# Patient Record
Sex: Male | Born: 1970
Health system: Southern US, Community
[De-identification: ages and names within clinical notes are randomized; demographics above are authoritative.]

## PROBLEM LIST (undated history)

## (undated) DIAGNOSIS — J45909 Unspecified asthma, uncomplicated: Secondary | ICD-10-CM

## (undated) DIAGNOSIS — I456 Pre-excitation syndrome: Secondary | ICD-10-CM

## (undated) DIAGNOSIS — F1021 Alcohol dependence, in remission: Secondary | ICD-10-CM

## (undated) DIAGNOSIS — I10 Essential (primary) hypertension: Secondary | ICD-10-CM

## (undated) DIAGNOSIS — T7840XA Allergy, unspecified, initial encounter: Secondary | ICD-10-CM

## (undated) HISTORY — PX: CORONARY ARTERY BYPASS GRAFT: SHX141

## (undated) HISTORY — DX: Pre-excitation syndrome: I45.6

## (undated) HISTORY — DX: Allergy, unspecified, initial encounter: T78.40XA

## (undated) HISTORY — PX: CARDIAC SURGERY: SHX584

## (undated) HISTORY — DX: Essential (primary) hypertension: I10

## (undated) HISTORY — DX: Unspecified asthma, uncomplicated: J45.909

## (undated) HISTORY — DX: Alcohol dependence, in remission: F10.21

---

## 2008-01-13 ENCOUNTER — Emergency Department (HOSPITAL_COMMUNITY): Admission: EM | Admit: 2008-01-13 | Discharge: 2008-01-13 | Payer: Self-pay | Admitting: Emergency Medicine

## 2008-07-16 ENCOUNTER — Encounter: Admission: RE | Admit: 2008-07-16 | Discharge: 2008-07-16 | Payer: Self-pay | Admitting: Family Medicine

## 2009-04-26 ENCOUNTER — Inpatient Hospital Stay (HOSPITAL_COMMUNITY): Admission: EM | Admit: 2009-04-26 | Discharge: 2009-04-28 | Payer: Self-pay | Admitting: Emergency Medicine

## 2009-07-21 ENCOUNTER — Emergency Department (HOSPITAL_COMMUNITY): Admission: EM | Admit: 2009-07-21 | Discharge: 2009-07-22 | Payer: Self-pay | Admitting: Emergency Medicine

## 2010-02-15 ENCOUNTER — Emergency Department (HOSPITAL_COMMUNITY): Admission: EM | Admit: 2010-02-15 | Discharge: 2010-02-17 | Payer: Self-pay | Admitting: Emergency Medicine

## 2010-02-16 ENCOUNTER — Ambulatory Visit: Payer: Self-pay | Admitting: Psychiatry

## 2010-02-17 ENCOUNTER — Ambulatory Visit: Payer: Self-pay | Admitting: Psychiatry

## 2010-02-17 ENCOUNTER — Inpatient Hospital Stay (HOSPITAL_COMMUNITY): Admission: AD | Admit: 2010-02-17 | Discharge: 2010-02-24 | Payer: Self-pay | Admitting: Psychiatry

## 2010-06-18 ENCOUNTER — Encounter: Payer: Self-pay | Admitting: Family Medicine

## 2010-08-10 LAB — BASIC METABOLIC PANEL
BUN: 15 mg/dL (ref 6–23)
CO2: 26 mEq/L (ref 19–32)
GFR calc non Af Amer: >60 mL/min (ref >60)
Glucose, Bld: 100 mg/dL — ABNORMAL HIGH (ref 70–99)
Potassium: 2.5 mEq/L — CL (ref 3.5–5.1)

## 2010-08-10 LAB — BASIC METABOLIC PANEL WITH GFR
Calcium: 8.6 mg/dL (ref 8.4–10.5)
Chloride: 96 meq/L (ref 96–112)
Creatinine, Ser: 1.13 mg/dL (ref 0.4–1.5)
GFR calc Af Amer: >60 mL/min (ref >60)
Sodium: 135 meq/L (ref 135–145)

## 2010-08-10 LAB — POCT I-STAT, CHEM 8
BUN: 11 mg/dL (ref 6–23)
Calcium, Ion: 1.06 mmol/L — ABNORMAL LOW (ref 1.12–1.32)
Chloride: 96 meq/L (ref 96–112)
Creatinine, Ser: 0.8 mg/dL (ref 0.4–1.5)
Glucose, Bld: 122 mg/dL — ABNORMAL HIGH (ref 70–99)
HCT: 47 % (ref 39.0–52.0)
Hemoglobin: 16 g/dL (ref 13.0–17.0)
Potassium: 2.5 meq/L — CL (ref 3.5–5.1)
Sodium: 133 meq/L — ABNORMAL LOW (ref 135–145)
TCO2: 23 mmol/L (ref 0–100)

## 2010-08-10 LAB — DIFFERENTIAL
Basophils Absolute: 0 10*3/uL (ref 0.0–0.1)
Basophils Relative: 0 % (ref 0–1)
Eosinophils Absolute: 0 10*3/uL (ref 0.0–0.7)
Eosinophils Relative: 0 % (ref 0–5)
Lymphocytes Relative: 9 % — ABNORMAL LOW (ref 12–46)
Lymphs Abs: 0.5 K/uL — ABNORMAL LOW (ref 0.7–4.0)
Monocytes Absolute: 0.4 10*3/uL (ref 0.1–1.0)
Monocytes Relative: 7 % (ref 3–12)
Neutro Abs: 4.6 10*3/uL (ref 1.7–7.7)
Neutrophils Relative %: 84 % — ABNORMAL HIGH (ref 43–77)

## 2010-08-10 LAB — POTASSIUM
Potassium: 3.9 mEq/L (ref 3.5–5.1)
Potassium: 2.7 meq/L — CL (ref 3.5–5.1)
Potassium: 2.9 meq/L — ABNORMAL LOW (ref 3.5–5.1)
Potassium: 3.1 meq/L — ABNORMAL LOW (ref 3.5–5.1)

## 2010-08-10 LAB — CBC
HCT: 44.9 % (ref 39.0–52.0)
Hemoglobin: 15.6 g/dL (ref 13.0–17.0)
MCH: 33.4 pg (ref 26.0–34.0)
MCHC: 34.7 g/dL (ref 30.0–36.0)
MCV: 96.3 fL (ref 78.0–100.0)
Platelets: 106 K/uL — ABNORMAL LOW (ref 150–400)
RBC: 4.66 MIL/uL (ref 4.22–5.81)
RDW: 14.3 % (ref 11.5–15.5)
WBC: 5.4 K/uL (ref 4.0–10.5)

## 2010-08-10 LAB — TRICYCLICS SCREEN, URINE: TCA Scrn: NOT DETECTED

## 2010-08-10 LAB — RAPID URINE DRUG SCREEN, HOSP PERFORMED
Amphetamines: NOT DETECTED
Barbiturates: NOT DETECTED
Benzodiazepines: NOT DETECTED
Cocaine: NOT DETECTED
Opiates: NOT DETECTED
Tetrahydrocannabinol: NOT DETECTED

## 2010-08-10 LAB — ETHANOL: Alcohol, Ethyl (B): <5 mg/dL (ref 0–10)

## 2010-08-10 LAB — MAGNESIUM: Magnesium: 2.2 mg/dL (ref 1.5–2.5)

## 2010-08-29 LAB — BASIC METABOLIC PANEL
BUN: 6 mg/dL (ref 6–23)
Chloride: 109 mEq/L (ref 96–112)
Glucose, Bld: 103 mg/dL — ABNORMAL HIGH (ref 70–99)
Potassium: 3.7 mEq/L (ref 3.5–5.1)
Sodium: 138 mEq/L (ref 135–145)

## 2010-08-29 LAB — CBC
HCT: 37.3 % — ABNORMAL LOW (ref 39.0–52.0)
Hemoglobin: 12.6 g/dL — ABNORMAL LOW (ref 13.0–17.0)
MCV: 102.2 fL — ABNORMAL HIGH (ref 78.0–100.0)
Platelets: 144 10*3/uL — ABNORMAL LOW (ref 150–400)
RDW: 14.4 % (ref 11.5–15.5)

## 2010-08-29 LAB — HEPATIC FUNCTION PANEL
ALT: 56 U/L — ABNORMAL HIGH (ref 0–53)
AST: 55 U/L — ABNORMAL HIGH (ref 0–37)
Bilirubin, Direct: 0.3 mg/dL (ref 0.0–0.3)
Indirect Bilirubin: 0.9 mg/dL (ref 0.3–0.9)
Total Bilirubin: 1.2 mg/dL (ref 0.3–1.2)

## 2010-08-30 LAB — HEPATIC FUNCTION PANEL
ALT: 89 U/L — ABNORMAL HIGH (ref 0–53)
Alkaline Phosphatase: 43 U/L (ref 39–117)
Bilirubin, Direct: 0.4 mg/dL — ABNORMAL HIGH (ref 0.0–0.3)
Indirect Bilirubin: 1.1 mg/dL — ABNORMAL HIGH (ref 0.3–0.9)
Total Protein: 7.9 g/dL (ref 6.0–8.3)

## 2010-08-30 LAB — ETHANOL: Alcohol, Ethyl (B): <5 mg/dL (ref 0–10)

## 2010-08-30 LAB — RAPID URINE DRUG SCREEN, HOSP PERFORMED
Amphetamines: NOT DETECTED
Benzodiazepines: NOT DETECTED
Opiates: NOT DETECTED
Tetrahydrocannabinol: NOT DETECTED

## 2010-08-30 LAB — POCT I-STAT, CHEM 8
Calcium, Ion: 1.1 mmol/L — ABNORMAL LOW (ref 1.12–1.32)
Chloride: 92 mEq/L — ABNORMAL LOW (ref 96–112)
Glucose, Bld: 98 mg/dL (ref 70–99)
HCT: 49 % (ref 39.0–52.0)
Hemoglobin: 16.7 g/dL (ref 13.0–17.0)

## 2010-08-30 LAB — BASIC METABOLIC PANEL
BUN: 12 mg/dL (ref 6–23)
Calcium: 8.5 mg/dL (ref 8.4–10.5)
GFR calc non Af Amer: >60 mL/min (ref >60)
Glucose, Bld: 115 mg/dL — ABNORMAL HIGH (ref 70–99)
Potassium: 3.4 mEq/L — ABNORMAL LOW (ref 3.5–5.1)
Sodium: 137 mEq/L (ref 135–145)

## 2010-08-30 LAB — MAGNESIUM: Magnesium: 2 mg/dL (ref 1.5–2.5)

## 2010-08-30 LAB — SALICYLATE LEVEL: Salicylate Lvl: <4 mg/dL (ref 2.8–20.0)

## 2011-10-17 ENCOUNTER — Ambulatory Visit (INDEPENDENT_AMBULATORY_CARE_PROVIDER_SITE_OTHER): Payer: PRIVATE HEALTH INSURANCE | Admitting: Internal Medicine

## 2011-10-17 ENCOUNTER — Encounter: Payer: Self-pay | Admitting: Physician Assistant

## 2011-10-17 VITALS — BP 129/83 | HR 79 | Temp 98.1°F | Resp 20 | Ht 71.5 in | Wt 259.0 lb

## 2011-10-17 DIAGNOSIS — J45998 Other asthma: Secondary | ICD-10-CM

## 2011-10-17 DIAGNOSIS — J45909 Unspecified asthma, uncomplicated: Secondary | ICD-10-CM

## 2011-10-17 DIAGNOSIS — J309 Allergic rhinitis, unspecified: Secondary | ICD-10-CM

## 2011-10-17 MED ORDER — BECLOMETHASONE DIPROPIONATE 80 MCG/ACT IN AERS: 1.0000 | INHALATION_SPRAY | RESPIRATORY_TRACT | Status: DC | PRN

## 2011-10-17 MED ORDER — ALBUTEROL SULFATE HFA 108 (90 BASE) MCG/ACT IN AERS: 2.0000 | INHALATION_SPRAY | Freq: Four times a day (QID) | RESPIRATORY_TRACT | Status: DC | PRN

## 2011-10-17 MED ORDER — FLUTICASONE PROPIONATE 50 MCG/ACT NA SUSP: 2.0000 | Freq: Every day | NASAL | Status: DC

## 2011-10-17 NOTE — Progress Notes (Signed)
  Subjective:    Patient ID: William Gentry, male    DOB: [birth year], 41 y.o.   MRN: [id number]  HPISince last visit with PA McClung last year, has continued to have intermittent wheezing and shortness of breath. 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 He is to avoid this since childhood but has never had a comprehensive treatment plan No other particular medical problems    Review of Systems  Constitutional: Negative for fever, chills and fatigue.  HENT: Positive for congestion, rhinorrhea, sneezing and postnasal drip.   Eyes: Positive for itching.  Respiratory: Positive for cough, shortness of breath and wheezing. Negative for apnea.        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  Gastrointestinal: Negative for abdominal pain.       Objective:   Physical Exam Vital signs stable except obesity Conjunctivae injected/TMs clear/nares boggy with purulence/throat clear/no nodes Chest with wheezing on forced expiration bilaterally/mild Heart regular without murmurs rubs or gallops No peripheral edema       Assessment & Plan:  Problem #1 asthma with allergic rhinitis Meds ordered this encounter  Medications  .       . beclomethasone (QVAR) 80 MCG/ACT inhaler    Sig: Inhale 1 puff into the lungs as needed.    Dispense:  1 Inhaler    Refill:  12  . albuterol (PROVENTIL HFA;VENTOLIN HFA) 108 (90 BASE) MCG/ACT inhaler    Sig: Inhale 2 puffs into the lungs every 6 (six) hours as needed for wheezing.    Dispense:  1 Inhaler    Refill:  2  . fluticasone (FLONASE) 50 MCG/ACT nasal spray    Sig: Place 2 sprays into the nose daily.    Dispense:  1 g    Refill:  11   Recheck for stability in one month/center if  worse Add Zyrtec otc

## 2011-10-18 ENCOUNTER — Encounter: Payer: Self-pay | Admitting: Internal Medicine

## 2012-07-02 ENCOUNTER — Ambulatory Visit (INDEPENDENT_AMBULATORY_CARE_PROVIDER_SITE_OTHER): Payer: PRIVATE HEALTH INSURANCE | Admitting: Physician Assistant

## 2012-07-02 ENCOUNTER — Encounter: Payer: Self-pay | Admitting: Physician Assistant

## 2012-07-02 VITALS — BP 150/98 | HR 70 | Temp 97.9°F | Resp 16 | Ht 71.0 in | Wt 271.0 lb

## 2012-07-02 DIAGNOSIS — J310 Chronic rhinitis: Secondary | ICD-10-CM

## 2012-07-02 DIAGNOSIS — R030 Elevated blood-pressure reading, without diagnosis of hypertension: Secondary | ICD-10-CM

## 2012-07-02 DIAGNOSIS — J45909 Unspecified asthma, uncomplicated: Secondary | ICD-10-CM

## 2012-07-02 DIAGNOSIS — J309 Allergic rhinitis, unspecified: Secondary | ICD-10-CM

## 2012-07-02 DIAGNOSIS — J45998 Other asthma: Secondary | ICD-10-CM

## 2012-07-02 MED ORDER — FLUTICASONE PROPIONATE 50 MCG/ACT NA SUSP: 2.0000 | Freq: Every day | NASAL | Status: DC

## 2012-07-02 MED ORDER — ALBUTEROL SULFATE HFA 108 (90 BASE) MCG/ACT IN AERS: 2.0000 | INHALATION_SPRAY | Freq: Four times a day (QID) | RESPIRATORY_TRACT | Status: DC | PRN

## 2012-07-02 MED ORDER — MONTELUKAST SODIUM 10 MG PO TABS: 10.0000 mg | ORAL_TABLET | Freq: Every day | ORAL | Status: DC

## 2012-07-02 MED ORDER — FLUTICASONE-SALMETEROL 250-50 MCG/DOSE IN AEPB: 1.0000 | INHALATION_SPRAY | Freq: Two times a day (BID) | RESPIRATORY_TRACT | Status: DC

## 2012-07-03 ENCOUNTER — Telehealth: Payer: Self-pay | Admitting: *Deleted

## 2012-07-03 DIAGNOSIS — J45909 Unspecified asthma, uncomplicated: Secondary | ICD-10-CM | POA: Insufficient documentation

## 2012-07-03 DIAGNOSIS — J310 Chronic rhinitis: Secondary | ICD-10-CM | POA: Insufficient documentation

## 2012-07-03 MED ORDER — ALBUTEROL SULFATE HFA 108 (90 BASE) MCG/ACT IN AERS: 2.0000 | INHALATION_SPRAY | Freq: Four times a day (QID) | RESPIRATORY_TRACT | Status: DC | PRN

## 2012-07-03 MED ORDER — MOMETASONE FURO-FORMOTEROL FUM 200-5 MCG/ACT IN AERO: 2.0000 | INHALATION_SPRAY | Freq: Two times a day (BID) | RESPIRATORY_TRACT | Status: DC

## 2012-07-03 NOTE — Telephone Encounter (Signed)
[facility] sent a fax stating that they will not cover two meds:  Proventil--alternatives are ProAir or Ventolin  And  Advair--alternatives are Dulera or Symbicort  Can we new rx's sent in to them.  If you feel there are special medical reasons  Why this pt requires the nonpreffered medications, you can request a coverage review by calling [phone].

## 2012-07-03 NOTE — Telephone Encounter (Signed)
i sent new prescriptions

## 2012-07-03 NOTE — Patient Instructions (Addendum)
Cut down on sugar/carbs.  Check BP OOO and record.  Call me in 3 weeks with recordings and asthma update.

## 2012-07-03 NOTE — Progress Notes (Signed)
  Subjective:    Patient ID: William Gentry, male    DOB: 27-Sep-1970, 42 y.o.   MRN: [id number]  HPI 42 yr old AAM presents for asthma. He has been having nasal congestion and is wheezing on almost a daily basis.  He is out of all of his inhalers.  He hasn't taken his flonase since the fall.  He denies f/c.  Coughs only occasionally.  Quit drinking alcohol 2 years ago.  He is active in church participating in helping others to stop drinking and recovery from other family illnesses.  He eats ice cream almost daily.  He tries to workout regularly, but he has definitely gained weight since getting sober.   Review of Systems  All other systems reviewed and are negative.       Objective:   Physical Exam  Nursing note and vitals reviewed. Constitutional: He is oriented to person, place, and time. He appears well-developed and well-nourished.  HENT:  Head: Normocephalic and atraumatic.  Mouth/Throat: No oropharyngeal exudate.       Nasal turbinates pale and boggy and quite enlarged. Throat with PND  Neck: Normal range of motion.  Cardiovascular: Normal rate, regular rhythm and normal heart sounds.   Pulmonary/Chest: Effort normal. He has wheezes (with forced expiration at end expiration).  Neurological: He is alert and oriented to person, place, and time.  Skin: Skin is warm and dry.  Psychiatric: He has a normal mood and affect. His behavior is normal.        Assessment & Plan:  Asthma-insurance does not prefer advair although it has worked well for him.  We will try dulera and singulair.   Rhinitis-I also want him to restart the flonase. Increased BP without diagnosis of htn.  Check BP OOO and record.  Call me in 3 weeks with numbers.  Cut down on sugar, but great work on 2 years sobriety!!!  Consider sleep study if BPs consistently reading high when he calls.

## 2012-07-04 ENCOUNTER — Other Ambulatory Visit: Payer: Self-pay | Admitting: *Deleted

## 2012-07-04 DIAGNOSIS — J45909 Unspecified asthma, uncomplicated: Secondary | ICD-10-CM

## 2012-07-04 DIAGNOSIS — J45998 Other asthma: Secondary | ICD-10-CM

## 2012-07-04 MED ORDER — ALBUTEROL SULFATE HFA 108 (90 BASE) MCG/ACT IN AERS: 2.0000 | INHALATION_SPRAY | Freq: Four times a day (QID) | RESPIRATORY_TRACT | Status: DC | PRN

## 2012-07-09 ENCOUNTER — Telehealth: Payer: Self-pay | Admitting: Radiology

## 2012-07-09 NOTE — Telephone Encounter (Signed)
Spoke to pharmacy to advise [employer] okay per [employer] PA

## 2014-03-17 ENCOUNTER — Ambulatory Visit (INDEPENDENT_AMBULATORY_CARE_PROVIDER_SITE_OTHER): Payer: PRIVATE HEALTH INSURANCE | Admitting: Family Medicine

## 2014-03-17 ENCOUNTER — Encounter: Payer: Self-pay | Admitting: Family Medicine

## 2014-03-17 VITALS — BP 140/95 | HR 85 | Temp 97.9°F | Resp 20 | Ht 71.5 in | Wt 273.0 lb

## 2014-03-17 DIAGNOSIS — E7800 Pure hypercholesterolemia, unspecified: Secondary | ICD-10-CM

## 2014-03-17 DIAGNOSIS — R7302 Impaired glucose tolerance (oral): Secondary | ICD-10-CM

## 2014-03-17 DIAGNOSIS — R030 Elevated blood-pressure reading, without diagnosis of hypertension: Secondary | ICD-10-CM

## 2014-03-17 DIAGNOSIS — J45909 Unspecified asthma, uncomplicated: Secondary | ICD-10-CM

## 2014-03-17 DIAGNOSIS — J301 Allergic rhinitis due to pollen: Secondary | ICD-10-CM

## 2014-03-17 DIAGNOSIS — IMO0001 Reserved for inherently not codable concepts without codable children: Secondary | ICD-10-CM

## 2014-03-17 MED ORDER — ALBUTEROL SULFATE HFA 108 (90 BASE) MCG/ACT IN AERS: 2.0000 | INHALATION_SPRAY | Freq: Four times a day (QID) | RESPIRATORY_TRACT | Status: DC | PRN

## 2014-03-17 MED ORDER — BECLOMETHASONE DIPROPIONATE 40 MCG/ACT IN AERS: 2.0000 | INHALATION_SPRAY | Freq: Two times a day (BID) | RESPIRATORY_TRACT | Status: DC

## 2014-03-17 MED ORDER — CETIRIZINE HCL 10 MG PO TABS: 10.0000 mg | ORAL_TABLET | Freq: Every day | ORAL | Status: DC

## 2014-03-17 NOTE — Patient Instructions (Addendum)
Please use over-the-counter zyrtec.  This will help with your nasal congestion,  and thus breathing.   Please take the QVAR, your maintenance inhaler, twice a day (two puffs each time) The albuterol is your rescue inhaler.  Please contact us if you have any questions or concerns, or if symptoms worsen.    Asthma Asthma is a recurring condition in which the airways tighten and narrow. Asthma can make it difficult to breathe. It can cause coughing, wheezing, and shortness of breath. Asthma episodes, also called asthma attacks, range from minor to life-threatening. Asthma cannot be cured, but medicines and lifestyle changes can help control it. CAUSES Asthma is believed to be caused by inherited (genetic) and environmental factors, but its exact cause is unknown. Asthma may be triggered by allergens, lung infections, or irritants in the air. Asthma triggers are different for each person. Common triggers include:   Animal dander.  Dust mites.  Cockroaches.  Pollen from trees or grass.  Mold.  Smoke.  Air pollutants such as dust, household cleaners, hair sprays, aerosol sprays, paint fumes, strong chemicals, or strong odors.  Cold air, weather changes, and winds (which increase molds and pollens in the air).  Strong emotional expressions such as crying or laughing hard.  Stress.  Certain medicines (such as aspirin) or types of drugs (such as beta-blockers).  Sulfites in foods and drinks. Foods and drinks that may contain sulfites include dried fruit, potato chips, and sparkling grape juice.  Infections or inflammatory conditions such as the flu, a cold, or an inflammation of the nasal membranes (rhinitis).  Gastroesophageal reflux disease (GERD).  Exercise or strenuous activity. SYMPTOMS Symptoms may occur immediately after asthma is triggered or many hours later. Symptoms include:  Wheezing.  Excessive nighttime or early morning coughing.  Frequent or severe coughing with  a common cold.  Chest tightness.  Shortness of breath. DIAGNOSIS  The diagnosis of asthma is made by a review of your medical history and a physical exam. Tests may also be performed. These may include:  Lung function studies. These tests show how much air you breathe in and out.  Allergy tests.  Imaging tests such as X-rays. TREATMENT  Asthma cannot be cured, but it can usually be controlled. Treatment involves identifying and avoiding your asthma triggers. It also involves medicines. There are 2 classes of medicine used for asthma treatment:   Controller medicines. These prevent asthma symptoms from occurring. They are usually taken every day.  Reliever or rescue medicines. These quickly relieve asthma symptoms. They are used as needed and provide short-term relief. Your health care provider will help you create an asthma action plan. An asthma action plan is a written plan for managing and treating your asthma attacks. It includes a list of your asthma triggers and how they may be avoided. It also includes information on when medicines should be taken and when their dosage should be changed. An action plan may also involve the use of a device called a peak flow meter. A peak flow meter measures how well the lungs are working. It helps you monitor your condition. HOME CARE INSTRUCTIONS   Take medicines only as directed by your health care provider. Speak with your health care provider if you have questions about how or when to take the medicines.  Use a peak flow meter as directed by your health care provider. Record and keep track of readings.  Understand and use the action plan to help minimize or stop an asthma attack without  needing to seek medical care.  Control your home environment in the following ways to help prevent asthma attacks:  Do not smoke. Avoid being exposed to secondhand smoke.  Change your heating and air conditioning filter regularly.  Limit your use of  fireplaces and wood stoves.  Get rid of pests (such as roaches and mice) and their droppings.  Throw away plants if you see mold on them.  Clean your floors and dust regularly. Use unscented cleaning products.  Try to have someone else vacuum for you regularly. Stay out of rooms while they are being vacuumed and for a short while afterward. If you vacuum, use a dust mask from a hardware store, a double-layered or microfilter vacuum cleaner bag, or a vacuum cleaner with a HEPA filter.  Replace carpet with wood, tile, or vinyl flooring. Carpet can trap dander and dust.  Use allergy-proof pillows, mattress covers, and box spring covers.  Wash bed sheets and blankets every week in hot water and dry them in a dryer.  Use blankets that are made of polyester or cotton.  Clean bathrooms and kitchens with bleach. If possible, have someone repaint the walls in these rooms with mold-resistant paint. Keep out of the rooms that are being cleaned and painted.  Wash hands frequently. SEEK MEDICAL CARE IF:   You have wheezing, shortness of breath, or a cough even if taking medicine to prevent attacks.  The colored mucus you cough up (sputum) is thicker than usual.  Your sputum changes from clear or white to yellow, green, gray, or bloody.  You have any problems that may be related to the medicines you are taking (such as a rash, itching, swelling, or trouble breathing).  You are using a reliever medicine more than 2-3 times per week.  Your peak flow is still at 50-79% of your personal best after following your action plan for 1 hour.  You have a fever. SEEK IMMEDIATE MEDICAL CARE IF:   You seem to be getting worse and are unresponsive to treatment during an asthma attack.  You are short of breath even at rest.  You get short of breath when doing very little physical activity.  You have difficulty eating, drinking, or talking due to asthma symptoms.  You develop chest pain.  You  develop a fast heartbeat.  You have a bluish color to your lips or fingernails.  You are light-headed, dizzy, or faint.  Your peak flow is less than 50% of your personal best. MAKE SURE YOU:   Understand these instructions.  Will watch your condition.  Will get help right away if you are not doing well or get worse. Document Released: 05/14/2005 Document Revised: 09/28/2013 Document Reviewed: 12/11/2012 [facility] Patient Information 2015 [place], [place]. This information is not intended to replace advice given to you by your health care provider. Make sure you discuss any questions you have with your health care provider.

## 2014-03-17 NOTE — Progress Notes (Signed)
Subjective:    Patient ID: William Gentry, male    DOB: 15-Jun-1970, 43 y.o.   MRN: [id number]  Past Medical History  Diagnosis Date  . Wolff-Parkinson-White (WPW) syndrome     HPI  43 year old male with PMH of allergic rhinitis, asthma, WPW (s/p open heart surgery at 43 years old?) here today for a medication refill.  He says that he ran out about 2 weeks ago.  He has ran out of both the [facility] and albuterol.  He rarely uses the [facility].  But does use his rescue inhaler about twice daily.  He has had increased wheezing with the seasonal weather change and with dust and dander.  He admitted to some nasal congestion, but mowed a few lawns which may be causing his congestion.   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   He is a former smoker since 2008/09 from 7-8 years of smoking about 1pk/day. No etoh for 4 years.    ~~Wife called the office number while patient was in his appointment, complaining of urinary frequency and requesting patient have his prostate checked.  He states that he has about 2L of caffeinated soda during his night shift, which is contributing to his urination.  He states he had no dysuria.  Just frequency.  He was uninterested in any further work up at  this at this time, but would be ready at his physical exam.    Review of Systems  Constitutional: Negative for fever, chills and fatigue.  HENT: Positive for congestion. Negative for rhinorrhea, sinus pressure and sneezing.   Respiratory: Positive for wheezing. Negative for cough.   Cardiovascular: Negative for chest pain, palpitations and leg swelling.  Genitourinary: Positive for frequency.  Neurological: Negative for dizziness and light-headedness.       Objective:   Physical Exam  Constitutional: He is oriented to person, place, and time. He appears well-developed and well-nourished.  BP 140/95   Pulse 85  Temp(Src) 97.9 F (36.6 C)  Resp 20  Ht 5' 11.5" (1.816 m)  Wt 273 lb (123.832 kg)  BMI 37.55 kg/m2  SpO2 95%  HENT:  Nose: Mucosal edema and rhinorrhea present.  Mouth/Throat: Oropharynx is clear and moist. Mucous membranes are not cyanotic. No oropharyngeal exudate, posterior oropharyngeal edema or posterior oropharyngeal erythema.  Cardiovascular: Normal rate, regular rhythm and normal heart sounds.   Pulmonary/Chest: Effort normal. No respiratory distress. He has wheezes (Coarse breath sounds.  Some absent breath sounds with expiration.  ).  Neurological: He is alert and oriented to person, place, and time.  Skin: Skin is warm and dry.  Psychiatric: He has a normal mood and affect. His behavior is normal. Judgment and thought content normal.    Peak Expiratory Flow (not any tx for 2 weeks): 480.  Predicted=641      Assessment & Plan:   Asthma, unspecified asthma severity, uncomplicated -Ordered Peak Expiratory Flow abnormal though not on any preventative nor rescue inhaler.  He maintains an adequate O2.  Concerned that he may not understand the role of the preventative medication vs. "rescue". -Spirometry--Incomplete due to pre-syncopal episode.  SPO2 maintained at 95%.  We will recheck this at the physical exam scheduled in 3 months. -Educated patient both verbally and written on asthma and the role of each inhaler.  He stated that he understood what to do.   -  Beclamethasone (QVAR) 40 MCG/ACT inhaler ORDERED -albuterol (PROVENTIL HFA;VENTOLIN HFA) 108 (90 BASE) MCG/ACT inhaler ORDERED  Allergic rhinitis due to pollen - cetirizine (ZYRTEC) 10 MG tablet ORDERED  Blood pressure elevated Denies any chest pain, palpitations, or dizziness.  Elevated with past vital signs.  He verbalized that he is going to change his diet.  He also stated that he had started exercising a few months ago, because he was fearful of an asthma attack.  He has three months to make a solid effort  of lifestyle modifications.  We will recheck his blood pressure at his physical and decide if pharmacologic therapies are the next step.      Trena PlattStephanie Donyetta Ogletree, PA-C [facility] 10/21/20155:00 PM

## 2014-03-19 ENCOUNTER — Telehealth: Payer: Self-pay | Admitting: Family Medicine

## 2014-03-19 NOTE — Telephone Encounter (Signed)
[facility] Proventil [facility] sent in. Insurance does not cover and wanted to know if they could change to a covered inhaler Ventolin. Gave the verbal ok to change to Ventolin

## 2014-03-25 DIAGNOSIS — I10 Essential (primary) hypertension: Secondary | ICD-10-CM | POA: Insufficient documentation

## 2014-03-25 DIAGNOSIS — R7303 Prediabetes: Secondary | ICD-10-CM | POA: Insufficient documentation

## 2014-03-25 DIAGNOSIS — E7800 Pure hypercholesterolemia, unspecified: Secondary | ICD-10-CM | POA: Insufficient documentation

## 2014-03-25 NOTE — Progress Notes (Signed)
History and physical examinations obtained with Stephanie English, PA-C. Agree with assessment and plan. 

## 2014-04-19 ENCOUNTER — Other Ambulatory Visit: Payer: Self-pay

## 2014-04-19 DIAGNOSIS — J45909 Unspecified asthma, uncomplicated: Secondary | ICD-10-CM

## 2014-04-19 MED ORDER — BECLOMETHASONE DIPROPIONATE 40 MCG/ACT IN AERS: 2.0000 | INHALATION_SPRAY | Freq: Two times a day (BID) | RESPIRATORY_TRACT | Status: DC

## 2014-06-21 ENCOUNTER — Encounter: Payer: PRIVATE HEALTH INSURANCE | Admitting: Family Medicine

## 2014-08-02 ENCOUNTER — Encounter: Payer: PRIVATE HEALTH INSURANCE | Admitting: Family Medicine

## 2014-09-01 ENCOUNTER — Encounter: Payer: PRIVATE HEALTH INSURANCE | Admitting: Family Medicine

## 2015-04-04 ENCOUNTER — Encounter: Payer: Self-pay | Admitting: Family Medicine

## 2015-04-04 ENCOUNTER — Ambulatory Visit (INDEPENDENT_AMBULATORY_CARE_PROVIDER_SITE_OTHER): Payer: PRIVATE HEALTH INSURANCE | Admitting: Family Medicine

## 2015-04-04 VITALS — BP 116/78 | HR 106 | Temp 100.1°F | Resp 16 | Ht 71.5 in | Wt 276.0 lb

## 2015-04-04 DIAGNOSIS — J45909 Unspecified asthma, uncomplicated: Secondary | ICD-10-CM

## 2015-04-04 DIAGNOSIS — J069 Acute upper respiratory infection, unspecified: Secondary | ICD-10-CM | POA: Diagnosis not present

## 2015-04-04 DIAGNOSIS — J4521 Mild intermittent asthma with (acute) exacerbation: Secondary | ICD-10-CM | POA: Diagnosis not present

## 2015-04-04 DIAGNOSIS — J09X2 Influenza due to identified novel influenza A virus with other respiratory manifestations: Secondary | ICD-10-CM | POA: Diagnosis not present

## 2015-04-04 LAB — POCT INFLUENZA A/B
INFLUENZA A, POC: POSITIVE — AB
INFLUENZA B, POC: NEGATIVE

## 2015-04-04 MED ORDER — PREDNISONE 20 MG PO TABS: ORAL_TABLET | ORAL | Status: DC

## 2015-04-04 MED ORDER — IPRATROPIUM BROMIDE 0.02 % IN SOLN
0.5000 mg | Freq: Once | RESPIRATORY_TRACT | Status: AC
  Administered 2015-04-04: 0.5 mg via RESPIRATORY_TRACT

## 2015-04-04 MED ORDER — DOXYCYCLINE HYCLATE 100 MG PO CAPS: 100.0000 mg | ORAL_CAPSULE | Freq: Two times a day (BID) | ORAL | Status: DC

## 2015-04-04 MED ORDER — ALBUTEROL SULFATE HFA 108 (90 BASE) MCG/ACT IN AERS: 2.0000 | INHALATION_SPRAY | Freq: Four times a day (QID) | RESPIRATORY_TRACT | Status: DC | PRN

## 2015-04-04 MED ORDER — OSELTAMIVIR PHOSPHATE 75 MG PO CAPS: 75.0000 mg | ORAL_CAPSULE | Freq: Two times a day (BID) | ORAL | Status: DC

## 2015-04-04 MED ORDER — MUCINEX DM 30-600 MG PO TB12: 1.0000 | ORAL_TABLET | Freq: Two times a day (BID) | ORAL | Status: DC | PRN

## 2015-04-04 MED ORDER — ALBUTEROL SULFATE (2.5 MG/3ML) 0.083% IN NEBU
2.5000 mg | INHALATION_SOLUTION | Freq: Once | RESPIRATORY_TRACT | Status: AC
  Administered 2015-04-04: 2.5 mg via RESPIRATORY_TRACT

## 2015-04-04 NOTE — Progress Notes (Signed)
Subjective:    Patient ID: William Gentry, male    DOB: 1970-07-10, 44 y.o.   MRN: [id number]  04/04/2015  Medication Refill; Fever; Shortness of Breath; and Wheezing   HPI This 44 y.o. male presents for acute visit.  Onset two days ago.  Tmax 102.  +chills/sweats.  No headache.  +ST diffuse; no ear pain.  Laying around ;went to work this morning.  No pain with swallowing.  +rhinorrhea; +nasal congestion; brown.  +coughing moderate; +wheezing a lot.  +SOB.  No v/d.  No rash.  Family members sick.  No tobacco.  Taking Albuterol as needed; ran out yesterday.    HTN:  Started on medication last month; 120-124/70-80.   Review of Systems  Constitutional: Positive for fever, chills, diaphoresis and fatigue. Negative for activity change and appetite change.  HENT: Positive for congestion, postnasal drip, rhinorrhea, sinus pressure, sore throat and trouble swallowing. Negative for ear pain and voice change.   Respiratory: Positive for cough, shortness of breath and wheezing.   Cardiovascular: Negative for chest pain, palpitations and leg swelling.  Gastrointestinal: Negative for nausea, vomiting, abdominal pain and diarrhea.  Endocrine: Negative for cold intolerance, heat intolerance, polydipsia, polyphagia and polyuria.  Skin: Negative for color change, rash and wound.  Neurological: Negative for dizziness, tremors, seizures, syncope, facial asymmetry, speech difficulty, weakness, light-headedness, numbness and headaches.  Psychiatric/Behavioral: Negative for sleep disturbance and dysphoric mood. The patient is not nervous/anxious.     Past Medical History  Diagnosis Date  . Wolff-Parkinson-White (WPW) syndrome   . Allergy   . Asthma   . Hypertension   . Alcoholism in recovery [facility])     recovery since 2011   Past Surgical History  Procedure Laterality Date  . Cardiac surgery     No Known Allergies  Social History   Social History  . Marital Status: Married    Spouse Name: N/A    . Number of Children: N/A  . Years of Education: N/A   Occupational History  . Not on file.   Social History Main Topics  . Smoking status: Former Games developer  . Smokeless tobacco: Not on file  . Alcohol Use: No     Comment: former alcoholic, clean for four years (2015)  . Drug Use: No  . Sexual Activity: Yes   Other Topics Concern  . Not on file   Social History Narrative   Marital status: married      Children: 4 girls; 2 grandchildren      Lives: with wife, 2 youngest (9,7)      Employment: [employer]      Tobacco: quit smoking; smoked x 7 years.      Alcohol: quit drinking 4 years ago; heavy.      Drugs: none      Exercise:  trying   Family History  Problem Relation Age of Onset  . Cancer Mother     brain cancer  . Lupus Mother        Objective:    BP 116/78 mmHg  Pulse 106  Temp(Src) 100.1 F (37.8 C) (Oral)  Resp 16  Ht 5' 11.5" (1.816 m)  Wt 276 lb (125.193 kg)  BMI 37.96 kg/m2  SpO2 96% Physical Exam  Constitutional: He is oriented to person, place, and time. He appears well-developed and well-nourished. No distress.  HENT:  Head: Normocephalic and atraumatic.  Right Ear: External ear normal.  Left Ear: External ear normal.  Nose: Nose normal.  Mouth/Throat: Oropharynx is clear  and moist.  Eyes: Conjunctivae and EOM are normal. Pupils are equal, round, and reactive to light.  Neck: Normal range of motion. Neck supple. Carotid bruit is not present. No thyromegaly present.  Cardiovascular: Normal rate, regular rhythm, normal heart sounds and intact distal pulses.  Exam reveals no gallop and no friction rub.   No murmur heard. Pulmonary/Chest: Effort normal. No accessory muscle usage. No respiratory distress. He has wheezes in the right upper field, the right middle field, the right lower field, the left upper field, the left middle field and the left lower field. He has no rhonchi. He has no rales.  Abdominal: Soft. Bowel sounds are normal. He  exhibits no distension and no mass. There is no tenderness. There is no rebound and no guarding.  Lymphadenopathy:    He has no cervical adenopathy.  Neurological: He is alert and oriented to person, place, and time. No cranial nerve deficit.  Skin: Skin is warm and dry. No rash noted. He is not diaphoretic.  Psychiatric: He has a normal mood and affect. His behavior is normal.  Nursing note and vitals reviewed.  Results for orders placed or performed in visit on 04/04/15  POCT Influenza A/B  Result Value Ref Range   Influenza A, POC Positive (A) Negative   Influenza B, POC Negative Negative   ALBUTEROL/ATROVENT NEBULIZER ADMINISTERED IN OFFICE. POST-NEBULIZER: INCREASED AIR MOVEMENT; DECREASED WHEEZING.    Assessment & Plan:   1. Acute upper respiratory infection   2. Asthma with exacerbation, mild intermittent   3. Asthma, unspecified asthma severity, uncomplicated   4. Influenza due to identified novel influenza A virus with other respiratory manifestations     -New. -Rx for Tamiflu and Mucinex provided. -Rx for Prednisone, Albuterol provided. -s/p nebulizer treatment in office. -RTC for worsening wheezing or respiratory distress.   Orders Placed This Encounter  Procedures  . POCT Influenza A/B   Meds ordered this encounter  Medications  . albuterol (PROVENTIL) (2.5 MG/3ML) 0.083% nebulizer solution 2.5 mg    Sig:   . ipratropium (ATROVENT) nebulizer solution 0.5 mg    Sig:   . DISCONTD: albuterol (PROVENTIL HFA;VENTOLIN HFA) 108 (90 BASE) MCG/ACT inhaler    Sig: Inhale 2 puffs into the lungs every 6 (six) hours as needed for wheezing or shortness of breath.    Dispense:  1 Inhaler    Refill:  2  . DISCONTD: predniSONE (DELTASONE) 20 MG tablet    Sig: Three tablets daily x 2 days then two tablets daily x 5 days then one tablet daily x 5 days    Dispense:  21 tablet    Refill:  0  . DISCONTD: doxycycline (VIBRAMYCIN) 100 MG capsule    Sig: Take 1 capsule (100 mg  total) by mouth 2 (two) times daily.    Dispense:  20 capsule    Refill:  0  . predniSONE (DELTASONE) 20 MG tablet    Sig: Three tablets daily x 2 days then two tablets daily x 5 days then one tablet daily x 5 days    Dispense:  21 tablet    Refill:  0  . albuterol (PROVENTIL HFA;VENTOLIN HFA) 108 (90 BASE) MCG/ACT inhaler    Sig: Inhale 2 puffs into the lungs every 6 (six) hours as needed for wheezing or shortness of breath.    Dispense:  1 Inhaler    Refill:  2  . doxycycline (VIBRAMYCIN) 100 MG capsule    Sig: Take 1 capsule (100 mg total) by  mouth 2 (two) times daily.    Dispense:  20 capsule    Refill:  0  . Dextromethorphan-Guaifenesin (MUCINEX DM) 30-600 MG TB12    Sig: Take 1-2 tablets by mouth 2 (two) times daily as needed.    Dispense:  28 each    Refill:  0  . oseltamivir (TAMIFLU) 75 MG capsule    Sig: Take 1 capsule (75 mg total) by mouth 2 (two) times daily.    Dispense:  10 capsule    Refill:  0    No Follow-up on file.     Ark Agrusa Paulita Fujita, M.D. [facility] [address] [place], [place]  [address] [phone] phone [phone] fax

## 2015-04-04 NOTE — Patient Instructions (Signed)

## 2015-04-26 ENCOUNTER — Encounter: Payer: Self-pay | Admitting: Family Medicine

## 2015-06-09 ENCOUNTER — Other Ambulatory Visit: Payer: Self-pay | Admitting: Family Medicine

## 2015-12-07 ENCOUNTER — Ambulatory Visit (INDEPENDENT_AMBULATORY_CARE_PROVIDER_SITE_OTHER): Payer: BLUE CROSS/BLUE SHIELD | Admitting: Family Medicine

## 2015-12-07 ENCOUNTER — Encounter: Payer: Self-pay | Admitting: Family Medicine

## 2015-12-07 VITALS — BP 124/78 | HR 78 | Temp 98.4°F | Ht 71.5 in | Wt 283.8 lb

## 2015-12-07 DIAGNOSIS — I10 Essential (primary) hypertension: Secondary | ICD-10-CM | POA: Diagnosis not present

## 2015-12-07 DIAGNOSIS — R350 Frequency of micturition: Secondary | ICD-10-CM | POA: Diagnosis not present

## 2015-12-07 DIAGNOSIS — J4530 Mild persistent asthma, uncomplicated: Secondary | ICD-10-CM | POA: Diagnosis not present

## 2015-12-07 LAB — COMPREHENSIVE METABOLIC PANEL
ALK PHOS: 43 U/L (ref 39–117)
ALT: 17 U/L (ref 0–53)
AST: 15 U/L (ref 0–37)
Albumin: 4.2 g/dL (ref 3.5–5.2)
BILIRUBIN TOTAL: 0.9 mg/dL (ref 0.2–1.2)
BUN: 11 mg/dL (ref 6–23)
CO2: 28 meq/L (ref 19–32)
Calcium: 9.9 mg/dL (ref 8.4–10.5)
Chloride: 104 mEq/L (ref 96–112)
Creatinine, Ser: 0.83 mg/dL (ref 0.40–1.50)
GFR: 127.92 mL/min (ref >60.00)
GLUCOSE: 105 mg/dL — AB (ref 70–99)
Potassium: 4.1 mEq/L (ref 3.5–5.1)
SODIUM: 138 meq/L (ref 135–145)
TOTAL PROTEIN: 7.7 g/dL (ref 6.0–8.3)

## 2015-12-07 LAB — POCT URINALYSIS DIPSTICK
Blood, UA: NEGATIVE
GLUCOSE UA: NEGATIVE
Ketones, UA: NEGATIVE
Leukocytes, UA: NEGATIVE
NITRITE UA: NEGATIVE
PH UA: 5.5
PROTEIN UA: NEGATIVE
Spec Grav, UA: >=1.03
Urobilinogen, UA: 0.2

## 2015-12-07 LAB — HEMOGLOBIN A1C: Hgb A1c MFr Bld: 6 % (ref 4.6–6.5)

## 2015-12-07 NOTE — Progress Notes (Signed)
Patient ID: William Gentry, male   DOB: 03/26/1971, 45 y.o.   MRN: [id number]  William Rumps, MD Phone: [phone]  William Gentry is a 45 y.o. male who presents today for new patient visit.  Hypertension: Patient notes he was on medications for the last 8-10 months. Does not know what the medication was. Has not been checking. Has not been on medications in the last 3 weeks. Blood pressures in the normal range today. No chest pain. No edema. Does note some occasional shortness of breath related to his asthma.  Asthma: Patient notes typically with change in seasons to the summer and fall he has increased trouble breathing and inhaler usage. Not currently on a controller medication. Has been using his albuterol inhaler 3 times a week for this. No waking up at night. No wheezing. Notes his symptoms always improve with the inhaler. No associated chest pain or diaphoresis.  Urinary urgency: Patient notes recently he has had some urgency with urination. Notes some urinary frequency as well. Notes normal stream. No dysuria or penile discharge. No nocturia. Does drink a lot of soda. Starts out with at least 2 L a day. No polydipsia.  Active Ambulatory Problems    Diagnosis Date Noted  . Asthma 07/03/2012  . Rhinitis 07/03/2012  . Essential hypertension 03/25/2014  . Pure hypercholesterolemia 03/25/2014  . Glucose intolerance (impaired glucose tolerance) 03/25/2014  . Urine frequency 12/07/2015   Resolved Ambulatory Problems    Diagnosis Date Noted  . No Resolved Ambulatory Problems   Past Medical History  Diagnosis Date  . Wolff-Parkinson-White (WPW) syndrome   . Allergy   . Hypertension   . Alcoholism in recovery [facility])     Family History  Problem Relation Age of Onset  . Cancer Mother     brain cancer  . Lupus Mother     Social History   Social History  . Marital Status: Married    Spouse Name: N/A  . Number of Children: N/A  . Years of Education: N/A   Occupational  History  . Not on file.   Social History Main Topics  . Smoking status: Former Research scientist (life sciences)  . Smokeless tobacco: Not on file  . Alcohol Use: No     Comment: former alcoholic, clean for four years (2015)  . Drug Use: No  . Sexual Activity: Yes   Other Topics Concern  . Not on file   Social History Narrative   Marital status: married      Children: 4 girls; 2 grandchildren      Lives: with wife, 2 youngest (9,7)      Employment: [employer]      Tobacco: quit smoking; smoked x 7 years.      Alcohol: quit drinking 4 years ago; heavy.      Drugs: none      Exercise:  trying    ROS  General:  Negative for nexplained weight loss, fever Skin: Negative for new or changing mole, sore that won't heal HEENT: Negative for trouble hearing, trouble seeing, ringing in ears, mouth sores, hoarseness, change in voice, dysphagia. CV:  Negative for chest pain, edema, palpitations Resp: Positive for dyspnea, Negative for cough, hemoptysis GI: Negative for nausea, vomiting, diarrhea, constipation, abdominal pain, melena, hematochezia. GU: Positive for urinary urgency, urinary frequency, Negative for dysuria, incontinence, urinary hesitance, hematuria, vaginal or penile discharge, polyuria, sexual difficulty, lumps in testicle or breasts MSK: Negative for muscle cramps or aches, joint pain or swelling Neuro: Negative for headaches, weakness, numbness,  dizziness, passing out/fainting Psych: Negative for depression, anxiety, memory problems  Objective  Physical Exam Filed Vitals:   12/07/15 0804  BP: 124/78  Pulse: 78  Temp: 98.4 F (36.9 C)    BP Readings from Last 3 Encounters:  12/07/15 124/78  04/04/15 116/78  03/17/14 140/95   Wt Readings from Last 3 Encounters:  12/07/15 283 lb 12.8 oz (128.731 kg)  04/04/15 276 lb (125.193 kg)  03/17/14 273 lb (123.832 kg)    Physical Exam  Constitutional: He is well-developed, well-nourished, and in no distress.  HENT:  Head: Normocephalic  and atraumatic.  Right Ear: External ear normal.  Left Ear: External ear normal.  Eyes: Conjunctivae are normal. Pupils are equal, round, and reactive to light.  Cardiovascular: Normal rate, regular rhythm and normal heart sounds.   Pulmonary/Chest: Effort normal and breath sounds normal.  Abdominal: Soft. Bowel sounds are normal. He exhibits no distension. There is no tenderness. There is no rebound and no guarding.  Musculoskeletal: He exhibits no edema.  Neurological: He is alert. Gait normal.  Skin: Skin is warm and dry. He is not diaphoretic.  Psychiatric: Mood and affect normal.     Assessment/Plan:   Asthma Patient notes slightly increased use of albuterol recently. No nighttime symptoms. No recent hospitalizations or prednisone usage. Discussed given 3 days a week usage he could benefit from an inhaled corticosteroid though he decided to defer this and wait several weeks to see if he improves. If he is not improving or he decides he wants to try the inhaled corticosteroid he will call us and we can send this in for him. He is given return precautions.  Essential hypertension Blood pressures at goal today. He has not been on medications for last 3 weeks. Discussed monitoring at home daily and recording. If greater than 140/90 persistently he will call and we can send in medication. Given return precautions.  Urine frequency Patient with urinary frequency and urgency recently. Benign abdominal exam. We will check a UA, A1c, and CMP. He deferred prostate exam today. We'll check it next visit. IP SS score of 6 with mostly satisfied    Orders Placed This Encounter  Procedures  . Comp Met (CMET)  . HgB A1c  . POCT Urinalysis Dipstick    William Rumps, MD [place]

## 2015-12-07 NOTE — Assessment & Plan Note (Addendum)
Patient with urinary frequency and urgency recently. Benign abdominal exam. We will check a UA, A1c, and CMP. He deferred prostate exam today. We'll check it next visit. IP SS score of 6 with mostly satisfied

## 2015-12-07 NOTE — Assessment & Plan Note (Addendum)
Blood pressures at goal today. He has not been on medications for last 3 weeks. Discussed monitoring at home daily and recording. If greater than 140/90 persistently he will call and we can send in medication. Given return precautions.

## 2015-12-07 NOTE — Assessment & Plan Note (Signed)
Patient notes slightly increased use of albuterol recently. No nighttime symptoms. No recent hospitalizations or prednisone usage. Discussed given 3 days a week usage he could benefit from an inhaled corticosteroid though he decided to defer this and wait several weeks to see if he improves. If he is not improving or he decides he wants to try the inhaled corticosteroid he will call us and we can send this in for him. He is given return precautions.

## 2015-12-07 NOTE — Patient Instructions (Signed)
Nice to meet you. We will check some lab work to evaluate your urination issues.  Please continue to use your albuterol inhaler as needed for wheezing and shortness of breath. If you decide you want to add another inhaler such as Qvar please give us a call. Please monitor your blood pressure daily at home. If persistently greater than 140/90 please let us know if we can start medication. If you develop chest pain, shortness of breath, blood pressure greater than 180/110, abdominal pain, burning with urination, or any new or changing symptoms please seek medical attention.

## 2015-12-07 NOTE — Progress Notes (Signed)
Pre visit review using our clinic review tool, if applicable. No additional management support is needed unless otherwise documented below in the visit note. 

## 2015-12-09 ENCOUNTER — Encounter: Payer: Self-pay | Admitting: Family Medicine

## 2015-12-30 ENCOUNTER — Other Ambulatory Visit: Payer: Self-pay | Admitting: Family Medicine

## 2016-01-03 ENCOUNTER — Telehealth: Payer: Self-pay

## 2016-01-03 NOTE — Telephone Encounter (Signed)
Please contact the patient and see if he is having any symptoms. Please determine frequency of albuterol use, frequency of wheezing or shortness of breath, presence of cough, any nighttime symptoms, or any other symptoms. Once I have this information I will send in a corticosteroid inhaler.

## 2016-01-03 NOTE — Telephone Encounter (Signed)
Left a VM to return my call. 

## 2016-01-03 NOTE — Telephone Encounter (Signed)
Patient called [facility], he is wanting a Rx for a inhaler that he was told at his last OV he could call and get if needed. Per the OV note, he was advised to call if it was needed in a few weeks. Please order if needed. thanks

## 2016-01-04 ENCOUNTER — Telehealth: Payer: Self-pay | Admitting: Family Medicine

## 2016-01-04 MED ORDER — ALBUTEROL SULFATE HFA 108 (90 BASE) MCG/ACT IN AERS: INHALATION_SPRAY | RESPIRATORY_TRACT | 2 refills | Status: DC

## 2016-01-04 NOTE — Telephone Encounter (Signed)
Sent to pharmacy 

## 2016-01-04 NOTE — Telephone Encounter (Signed)
This was handled by a CMA, thanks

## 2016-01-04 NOTE — Telephone Encounter (Signed)
Pt is not having any symptoms at this time , no wheezing ,SOB or night time symptoms. He uses this inhaler for work purposes, he stated that he has used it a little more frequently, possible due to work load.

## 2016-01-04 NOTE — Telephone Encounter (Signed)
Pt wife called about pt needing a refill for VENTOLIN HFA 108 (90 Base) MCG/ACT inhaler  Pharmacy is [facility] [phone] - , [place] - [address]  Call pt @ [phone]. Thank you!

## 2016-01-12 ENCOUNTER — Encounter: Payer: Self-pay | Admitting: Family Medicine

## 2016-01-12 ENCOUNTER — Encounter (INDEPENDENT_AMBULATORY_CARE_PROVIDER_SITE_OTHER): Payer: Self-pay

## 2016-01-12 ENCOUNTER — Ambulatory Visit (INDEPENDENT_AMBULATORY_CARE_PROVIDER_SITE_OTHER): Payer: BLUE CROSS/BLUE SHIELD | Admitting: Family Medicine

## 2016-01-12 DIAGNOSIS — I10 Essential (primary) hypertension: Secondary | ICD-10-CM

## 2016-01-12 DIAGNOSIS — J4520 Mild intermittent asthma, uncomplicated: Secondary | ICD-10-CM | POA: Diagnosis not present

## 2016-01-12 DIAGNOSIS — R350 Frequency of micturition: Secondary | ICD-10-CM

## 2016-01-12 DIAGNOSIS — R7303 Prediabetes: Secondary | ICD-10-CM | POA: Diagnosis not present

## 2016-01-12 NOTE — Patient Instructions (Signed)
Nice to see you. Please continue to monitor your blood pressure. If it starts to consistently be greater than 140/90 please let us know. Please start working on exercise. Please cut your sodium intake down. Please monitor your urination.

## 2016-01-12 NOTE — Progress Notes (Signed)
Pre visit review using our clinic review tool, if applicable. No additional management support is needed unless otherwise documented below in the visit note. 

## 2016-01-12 NOTE — Assessment & Plan Note (Signed)
Improved. Patient declined rectal exam today to check his prostate. He will continue to monitor.

## 2016-01-12 NOTE — Assessment & Plan Note (Signed)
Well-controlled. Minimal use of albuterol. Continue to monitor and if worsens could consider Qvar.

## 2016-01-12 NOTE — Progress Notes (Signed)
  William AlarEric Shakeem Stern, MD Phone: [phone]  William Gentry is a 45 y.o. male who presents today for follow-up.  HYPERTENSION  Disease Monitoring  Home BP Monitoring checking intermittently, typically 120s-130s/80s. Does report he checked it a right aide several weeks ago and was 189/80. Chest pain- no  Dyspnea-  no has not been on medications as his blood pressure has been well controlled.  Frequent urination: Patient notes this is improved. Not as frequent. Does not want to proceed with a prostate exam today.  Prediabetes: Diagnosed with A1c of 6.0. Has not been exercising over the last 3-4 months. He is going to start exercising in the next several weeks. Does not watch what he eats. He drinks at least 2-3 L of soda per day.  Asthma: Using his albuterol inhaler 2-3 times a week. No nighttime symptoms. Minimal trouble breathing and uses the inhaler and it improves. Not on any controller medication at this time.   PMH: n Former smoker   ROS see history of present illness  Objective  Physical Exam Vitals:   01/12/16 0808  BP: 124/72  Pulse: 84  Temp: 98.4 F (36.9 C)    BP Readings from Last 3 Encounters:  01/12/16 124/72  12/07/15 124/78  04/04/15 116/78   Wt Readings from Last 3 Encounters:  01/12/16 281 lb 3.2 oz (127.6 kg)  12/07/15 283 lb 12.8 oz (128.7 kg)  04/04/15 276 lb (125.2 kg)    Physical Exam  Constitutional: No distress.  HENT:  Head: Normocephalic and atraumatic.  Cardiovascular: Normal rate, regular rhythm and normal heart sounds.   Pulmonary/Chest: Effort normal and breath sounds normal.  Musculoskeletal: He exhibits no edema.  Neurological: He is alert. Gait normal.  Skin: Skin is warm and dry. He is not diaphoretic.     Assessment/Plan: Please see individual problem list.  Essential hypertension Well-controlled at this time. Continue to monitor at home. Advised not to check at pharmacy. If greater than 140/90 consistently he will let us  know and we can start medication.  Asthma Well-controlled. Minimal use of albuterol. Continue to monitor and if worsens could consider Qvar.  Prediabetes Discussed diet and exercise. Advised patient to cut back on sodas. Discussed cutting back to 1 L per day to start with. He will start exercising in the next several weeks.  Urine frequency Improved. Patient declined rectal exam today to check his prostate. He will continue to monitor.   William AlarEric Janessa Mickle, MD [facility] [place]

## 2016-01-12 NOTE — Assessment & Plan Note (Signed)
Discussed diet and exercise. Advised patient to cut back on sodas. Discussed cutting back to 1 L per day to start with. He will start exercising in the next several weeks.

## 2016-01-12 NOTE — Assessment & Plan Note (Signed)
Well-controlled at this time. Continue to monitor at home. Advised not to check at pharmacy. If greater than 140/90 consistently he will let us know and we can start medication.

## 2016-03-21 ENCOUNTER — Encounter: Payer: Self-pay | Admitting: Family Medicine

## 2016-03-21 ENCOUNTER — Ambulatory Visit (INDEPENDENT_AMBULATORY_CARE_PROVIDER_SITE_OTHER): Payer: BLUE CROSS/BLUE SHIELD | Admitting: Family Medicine

## 2016-03-21 VITALS — BP 120/76 | HR 73 | Temp 98.0°F | Ht 72.0 in | Wt 285.2 lb

## 2016-03-21 DIAGNOSIS — Z0001 Encounter for general adult medical examination with abnormal findings: Secondary | ICD-10-CM | POA: Insufficient documentation

## 2016-03-21 DIAGNOSIS — Z13220 Encounter for screening for lipoid disorders: Secondary | ICD-10-CM | POA: Diagnosis not present

## 2016-03-21 DIAGNOSIS — Z1329 Encounter for screening for other suspected endocrine disorder: Secondary | ICD-10-CM

## 2016-03-21 DIAGNOSIS — Z130 Encounter for screening for diseases of the blood and blood-forming organs and certain disorders involving the immune mechanism: Secondary | ICD-10-CM

## 2016-03-21 DIAGNOSIS — Z0000 Encounter for general adult medical examination without abnormal findings: Secondary | ICD-10-CM

## 2016-03-21 DIAGNOSIS — Z125 Encounter for screening for malignant neoplasm of prostate: Secondary | ICD-10-CM

## 2016-03-21 DIAGNOSIS — Z23 Encounter for immunization: Secondary | ICD-10-CM

## 2016-03-21 DIAGNOSIS — Z6838 Body mass index (BMI) 38.0-38.9, adult: Secondary | ICD-10-CM

## 2016-03-21 DIAGNOSIS — E6609 Other obesity due to excess calories: Secondary | ICD-10-CM

## 2016-03-21 DIAGNOSIS — R350 Frequency of micturition: Secondary | ICD-10-CM

## 2016-03-21 LAB — POCT URINALYSIS DIPSTICK
Bilirubin, UA: NEGATIVE
Glucose, UA: NEGATIVE
Ketones, UA: NEGATIVE
LEUKOCYTES UA: NEGATIVE
NITRITE UA: NEGATIVE
PH UA: 5.5
PROTEIN UA: NEGATIVE
RBC UA: NEGATIVE
Spec Grav, UA: 1.02
UROBILINOGEN UA: 0.2

## 2016-03-21 NOTE — Assessment & Plan Note (Addendum)
Patient overall doing well. Blood pressure well controlled. He is obese. We discussed diet and exercise. He was given a tetanus vaccination. He declines flu shot. Lab work will be ordered. EKG obtained as outlined above. We'll check his urine as he continues to have urinary frequency though I suspect this is related to caffeine intake. Other lab work as outlined below.

## 2016-03-21 NOTE — Progress Notes (Signed)
Pre visit review using our clinic review tool, if applicable. No additional management support is needed unless otherwise documented below in the visit note. 

## 2016-03-21 NOTE — Patient Instructions (Addendum)
Nice to see you. Please continue work on diet and exercise. I have included some dietary instructions below. We will obtain your lab work and call you with the results.  Diet Recommendations  Starchy (carb) foods: Bread, rice, pasta, potatoes, corn, cereal, grits, crackers, bagels, muffins, all baked goods.  (Fruits, milk, and yogurt also have carbohydrate, but most of these foods will not spike your blood sugar as the starchy foods will.)  A few fruits do cause high blood sugars; use small portions of bananas (limit to 1/2 at a time), grapes, watermelon, oranges, and most tropical fruits.    Protein foods: Meat, fish, poultry, eggs, dairy foods, and beans such as pinto and kidney beans (beans also provide carbohydrate).   1. Eat at least 3 meals and 1-2 snacks per day. Never go more than 4-5 hours while awake without eating. Eat breakfast within the first hour of getting up.   2. Limit starchy foods to TWO per meal and ONE per snack. ONE portion of a starchy  food is equal to the following:   - ONE slice of bread (or its equivalent, such as half of a hamburger bun).   - 1/2 cup of a "scoopable" starchy food such as potatoes or rice.   - 15 grams of carbohydrate as shown on food label.  3. Include at every meal: a protein food, a carb food, and vegetables and/or fruit.   - Obtain twice the volume of veg's as protein or carbohydrate foods for both lunch and dinner.   - Fresh or frozen veg's are best.   - Keep frozen veg's on hand for a quick vegetable serving.

## 2016-03-21 NOTE — Progress Notes (Signed)
[name], MD Phone: [phone]  William Gentry is a 45 y.o. male who presents today for physical exam.  Diet is described as regular. He has cut down on sodas. Only drinking 24 ounces a day. Was drinking 2 L. Eating out less. Exercising not very much though is going to start lifting weights. Tetanus vaccination up-to-date. He declines flu shot. Prior HIV testing negative per his report. Former smoker. No alcohol use. No illicit drug use. Sees his ophthalmologist once a year. Discussed seeing a dentist soon. His mother had lung cancer. No history of prostate or colon cancer. He has a list of labs and EKG that he needs completed for his physical. He notes urinary frequency is improved since decreasing his soda and caffeine intake.   Active Ambulatory Problems    Diagnosis Date Noted  . Asthma 07/03/2012  . Rhinitis 07/03/2012  . Essential hypertension 03/25/2014  . Pure hypercholesterolemia 03/25/2014  . Prediabetes 03/25/2014  . Urine frequency 12/07/2015  . Routine general medical examination at a health care facility 03/21/2016   Resolved Ambulatory Problems    Diagnosis Date Noted  . No Resolved Ambulatory Problems   Past Medical History:  Diagnosis Date  . Alcoholism in recovery ([place])   . Allergy   . Asthma   . Hypertension   . Wolff-Parkinson-White (WPW) syndrome     Family History  Problem Relation Age of Onset  . Cancer Mother     brain cancer  . Lupus Mother     Social History   Social History  . Marital status: Married    Spouse name: N/A  . Number of children: N/A  . Years of education: N/A   Occupational History  . Not on file.   Social History Main Topics  . Smoking status: Former Research scientist (life sciences)  . Smokeless tobacco: Never Used  . Alcohol use No     Comment: former alcoholic, clean for four years (2015)  . Drug use: No  . Sexual activity: Yes   Other Topics Concern  . Not on file   Social History Narrative   Marital status: married     Children: 4 girls; 2 grandchildren      Lives: with wife, 2 youngest (9,7)      Employment: [employer]      Tobacco: quit smoking; smoked x 7 years.      Alcohol: quit drinking 4 years ago; heavy.      Drugs: none      Exercise:  trying    ROS  General:  Negative for nexplained weight loss, fever Skin: Negative for new or changing mole, sore that won't heal HEENT: Negative for trouble hearing, trouble seeing, ringing in ears, mouth sores, hoarseness, change in voice, dysphagia. CV:  Negative for chest pain, dyspnea, edema, palpitations Resp: Negative for cough, dyspnea, hemoptysis GI: Negative for nausea, vomiting, diarrhea, constipation, abdominal pain, melena, hematochezia. GU: Positive for urinary frequency, Negative for dysuria, incontinence, urinary hesitance, hematuria, vaginal or penile discharge, polyuria, sexual difficulty, lumps in testicle or breasts MSK: Negative for muscle cramps or aches, joint pain or swelling Neuro: Negative for headaches, weakness, numbness, dizziness, passing out/fainting Psych: Negative for depression, anxiety, memory problems  Objective  Physical Exam Vitals:   03/21/16 0758  BP: 120/76  Pulse: 73  Temp: 98 F (36.7 C)    BP Readings from Last 3 Encounters:  03/21/16 120/76  01/12/16 124/72  12/07/15 124/78   Wt Readings from Last 3 Encounters:  03/21/16 285 lb 3.2 oz (  129.4 kg)  01/12/16 281 lb 3.2 oz (127.6 kg)  12/07/15 283 lb 12.8 oz (128.7 kg)    Physical Exam  Constitutional: He is well-developed, well-nourished, and in no distress.  HENT:  Head: Normocephalic and atraumatic.  Mouth/Throat: Oropharynx is clear and moist. No oropharyngeal exudate.  Eyes: Conjunctivae are normal. Pupils are equal, round, and reactive to light.  Neck: Neck supple.  Cardiovascular: Normal rate, regular rhythm and normal heart sounds.   Pulmonary/Chest: Effort normal and breath sounds normal.  Abdominal: Soft. Bowel sounds are normal. He  exhibits no distension. There is no tenderness. There is no rebound and no guarding.  Genitourinary:  Genitourinary Comments: Patient declined rectal exam  Musculoskeletal: He exhibits no edema.  Lymphadenopathy:    He has no cervical adenopathy.  Neurological: He is alert. Gait normal.  Skin: Skin is warm and dry.  Psychiatric: Mood and affect normal.   EKG: Normal sinus rhythm, rate 72, no ST or T-wave changes, no prior EKG to compare to  Assessment/Plan:   Routine general medical examination at a health care facility Patient overall doing well. Blood pressure well controlled. He is obese. We discussed diet and exercise. He was given a tetanus vaccination. He declines flu shot. Lab work will be ordered. EKG obtained as outlined above. We'll check his urine as he continues to have urinary frequency though I suspect this is related to caffeine intake. Other lab work as outlined below.   Orders Placed This Encounter  Procedures  . Tdap vaccine greater than or equal to 7yo IM  . CBC  . Lipid Profile  . TSH  . HgB A1c  . Comp Met (CMET)  . PSA  . POCT Urinalysis Dipstick  . EKG 12-Lead    No orders of the defined types were placed in this encounter.    [name], MD [place]

## 2016-03-22 ENCOUNTER — Telehealth: Payer: Self-pay | Admitting: Family Medicine

## 2016-03-22 LAB — COMPREHENSIVE METABOLIC PANEL
ALBUMIN: 4.3 g/dL (ref 3.5–5.5)
ALT: 24 IU/L (ref 0–44)
AST: 19 IU/L (ref 0–40)
Albumin/Globulin Ratio: 1.4 (ref 1.2–2.2)
Alkaline Phosphatase: 49 IU/L (ref 39–117)
BUN/Creatinine Ratio: 15 (ref 9–20)
BUN: 13 mg/dL (ref 6–24)
Bilirubin Total: 0.5 mg/dL (ref 0.0–1.2)
CALCIUM: 9.7 mg/dL (ref 8.7–10.2)
CHLORIDE: 98 mmol/L (ref 96–106)
CO2: 26 mmol/L (ref 18–29)
CREATININE: 0.87 mg/dL (ref 0.76–1.27)
GFR calc non Af Amer: 103 mL/min/{1.73_m2} (ref >59)
GFR, EST AFRICAN AMERICAN: 120 mL/min/{1.73_m2} (ref >59)
GLUCOSE: 96 mg/dL (ref 65–99)
Globulin, Total: 3 g/dL (ref 1.5–4.5)
Potassium: 4.9 mmol/L (ref 3.5–5.2)
Sodium: 137 mmol/L (ref 134–144)
TOTAL PROTEIN: 7.3 g/dL (ref 6.0–8.5)

## 2016-03-22 LAB — CBC
Hematocrit: 43.3 % (ref 37.5–51.0)
Hemoglobin: 14.1 g/dL (ref 12.6–17.7)
MCH: 28.5 pg (ref 26.6–33.0)
MCHC: 32.6 g/dL (ref 31.5–35.7)
MCV: 88 fL (ref 79–97)
PLATELETS: 261 10*3/uL (ref 150–379)
RBC: 4.94 x10E6/uL (ref 4.14–5.80)
RDW: 14.5 % (ref 12.3–15.4)
WBC: 6.6 10*3/uL (ref 3.4–10.8)

## 2016-03-22 LAB — TSH: TSH: 0.682 u[IU]/mL (ref 0.450–4.500)

## 2016-03-22 LAB — LIPID PANEL
CHOL/HDL RATIO: 8.3 ratio — AB (ref 0.0–5.0)
Cholesterol, Total: 258 mg/dL — ABNORMAL HIGH (ref 100–199)
HDL: 31 mg/dL — ABNORMAL LOW (ref >39)
LDL Calculated: 200 mg/dL — ABNORMAL HIGH (ref 0–99)
Triglycerides: 134 mg/dL (ref 0–149)
VLDL Cholesterol Cal: 27 mg/dL (ref 5–40)

## 2016-03-22 LAB — HEMOGLOBIN A1C
Est. average glucose Bld gHb Est-mCnc: 126 mg/dL
HEMOGLOBIN A1C: 6 % — AB (ref 4.8–5.6)

## 2016-03-22 LAB — PSA: PROSTATE SPECIFIC AG, SERUM: 0.7 ng/mL (ref 0.0–4.0)

## 2016-03-22 NOTE — Telephone Encounter (Signed)
Pt called back returning your call. Thank you!  Call pt @ [phone]

## 2016-03-23 ENCOUNTER — Other Ambulatory Visit: Payer: Self-pay | Admitting: Family Medicine

## 2016-03-23 MED ORDER — ROSUVASTATIN CALCIUM 20 MG PO TABS: 20.0000 mg | ORAL_TABLET | Freq: Every day | ORAL | 3 refills | Status: DC

## 2016-03-23 NOTE — Progress Notes (Signed)
c 

## 2016-03-23 NOTE — Telephone Encounter (Signed)
Notified patient of lab reuslts

## 2016-04-04 ENCOUNTER — Other Ambulatory Visit: Payer: Self-pay | Admitting: Family Medicine

## 2016-04-13 ENCOUNTER — Ambulatory Visit: Payer: BLUE CROSS/BLUE SHIELD | Admitting: Family Medicine

## 2016-05-09 ENCOUNTER — Other Ambulatory Visit: Payer: Self-pay | Admitting: Family Medicine

## 2016-09-07 ENCOUNTER — Encounter: Payer: Self-pay | Admitting: Family Medicine

## 2016-09-07 ENCOUNTER — Ambulatory Visit (INDEPENDENT_AMBULATORY_CARE_PROVIDER_SITE_OTHER): Payer: 59 | Admitting: Family Medicine

## 2016-09-07 DIAGNOSIS — M722 Plantar fascial fibromatosis: Secondary | ICD-10-CM | POA: Insufficient documentation

## 2016-09-07 MED ORDER — DICLOFENAC SODIUM 75 MG PO TBEC: 75.0000 mg | DELAYED_RELEASE_TABLET | Freq: Two times a day (BID) | ORAL | 0 refills | Status: DC | PRN

## 2016-09-07 NOTE — Progress Notes (Signed)
Pre visit review using our clinic review tool, if applicable. No additional management support is needed unless otherwise documented below in the visit note. 

## 2016-09-07 NOTE — Progress Notes (Signed)
Subjective:  Patient ID: William Gentry, male    DOB: Oct 23, 1970  Age: 46 y.o. MRN: [id number]  CC: Right heel pain  HPI:  46 year old male presents with the above complaint.  Patient states that he has had a two-month history of left heel pain. Started after he started a new job. He is on his feet all day. Pain is moderate to severe. Interferes with ambulation. Worse first thing in the morning and with prolonged sitting. He has been using heel inserts with some improvement. No other associated symptoms. No recent fall, trauma, injury. No other complaints or concerns at this time.   Social Hx   Social History   Social History  . Marital status: Married    Spouse name: N/A  . Number of children: N/A  . Years of education: N/A   Social History Main Topics  . Smoking status: Former Games developer  . Smokeless tobacco: Never Used  . Alcohol use No     Comment: former alcoholic, clean for four years (2015)  . Drug use: No  . Sexual activity: Yes   Other Topics Concern  . None   Social History Narrative   Marital status: married      Children: 4 girls; 2 grandchildren      Lives: with wife, 2 youngest (9,7)      Employment: [employer]      Tobacco: quit smoking; smoked x 7 years.      Alcohol: quit drinking 4 years ago; heavy.      Drugs: none      Exercise:  trying    Review of Systems  Constitutional: Negative.   Skin:       Heel pain.   Objective:  BP (!) 150/100   Pulse 97   Temp 98.1 F (36.7 C) (Oral)   Wt 277 lb 2 oz (125.7 kg)   SpO2 97%   BMI 37.58 kg/m   BP/Weight 09/07/2016 03/21/2016 01/12/2016  Systolic BP 150 120 124  Diastolic BP 100 76 72  Wt. (Lbs) 277.13 285.2 281.2  BMI 37.58 38.68 38.67   Physical Exam  Constitutional: He is oriented to person, place, and time. He appears well-developed. No distress.  Pulmonary/Chest: Effort normal.  Musculoskeletal:  Left heel - exquisitely tender to palpation at the attachment of the plantar fascia.    Neurological: He is alert and oriented to person, place, and time.  Psychiatric: He has a normal mood and affect.  Vitals reviewed.   Lab Results  Component Value Date   WBC 6.6 03/21/2016   HGB 16.0 02/16/2010   HCT 43.3 03/21/2016   PLT 261 03/21/2016   GLUCOSE 96 03/21/2016   CHOL 258 (H) 03/21/2016   TRIG 134 03/21/2016   HDL 31 (L) 03/21/2016   LDLCALC 200 (H) 03/21/2016   ALT 24 03/21/2016   AST 19 03/21/2016   NA 137 03/21/2016   K 4.9 03/21/2016   CL 98 03/21/2016   CREATININE 0.87 03/21/2016   BUN 13 03/21/2016   CO2 26 03/21/2016   TSH 0.682 03/21/2016   HGBA1C 6.0 (H) 03/21/2016    Assessment & Plan:   Problem List Items Addressed This Visit    Plantar fasciitis    New problem. Heel inserts. Handout for exercises given. PRN Diclofenac.         Meds ordered this encounter  Medications  . diclofenac (VOLTAREN) 75 MG EC tablet    Sig: Take 1 tablet (75 mg total) by mouth 2 (  two) times daily as needed.    Dispense:  30 tablet    Refill:  0   Follow-up: PRN  Everlene Other DO [facility]

## 2016-09-07 NOTE — Assessment & Plan Note (Signed)
New problem. Heel inserts. Handout for exercises given. PRN Diclofenac.

## 2016-09-07 NOTE — Patient Instructions (Signed)

## 2016-09-15 ENCOUNTER — Emergency Department
Admission: EM | Admit: 2016-09-15 | Discharge: 2016-09-15 | Disposition: A | Discharge status: Home / Self Care | Payer: 59 | Attending: Emergency Medicine | Admitting: Emergency Medicine

## 2016-09-15 ENCOUNTER — Encounter: Payer: Self-pay | Admitting: Medical Oncology

## 2016-09-15 DIAGNOSIS — R220 Localized swelling, mass and lump, head: Secondary | ICD-10-CM

## 2016-09-15 DIAGNOSIS — Z79899 Other long term (current) drug therapy: Secondary | ICD-10-CM | POA: Diagnosis not present

## 2016-09-15 DIAGNOSIS — I10 Essential (primary) hypertension: Secondary | ICD-10-CM | POA: Insufficient documentation

## 2016-09-15 DIAGNOSIS — T7840XA Allergy, unspecified, initial encounter: Secondary | ICD-10-CM | POA: Diagnosis not present

## 2016-09-15 DIAGNOSIS — J45909 Unspecified asthma, uncomplicated: Secondary | ICD-10-CM | POA: Diagnosis not present

## 2016-09-15 DIAGNOSIS — Z87891 Personal history of nicotine dependence: Secondary | ICD-10-CM | POA: Insufficient documentation

## 2016-09-15 MED ORDER — LORATADINE 10 MG PO TABS
10.0000 mg | ORAL_TABLET | Freq: Once | ORAL | Status: AC
  Administered 2016-09-15: 10 mg via ORAL
  Filled 2016-09-15: qty 1

## 2016-09-15 MED ORDER — DIPHENHYDRAMINE HCL 25 MG PO CAPS
50.0000 mg | ORAL_CAPSULE | Freq: Once | ORAL | Status: AC
  Administered 2016-09-15: 50 mg via ORAL
  Filled 2016-09-15: qty 2

## 2016-09-15 MED ORDER — DIPHENHYDRAMINE HCL 25 MG PO CAPS: 25.0000 mg | ORAL_CAPSULE | ORAL | 2 refills | Status: DC | PRN

## 2016-09-15 MED ORDER — PREDNISONE 20 MG PO TABS
60.0000 mg | ORAL_TABLET | Freq: Once | ORAL | Status: AC
  Administered 2016-09-15: 60 mg via ORAL
  Filled 2016-09-15: qty 3

## 2016-09-15 MED ORDER — PREDNISONE 10 MG (21) PO TBPK: ORAL_TABLET | ORAL | 0 refills | Status: DC

## 2016-09-15 NOTE — ED Provider Notes (Signed)
[facility] Emergency Department Provider Note       Time seen: ----------------------------------------- 5:50 PM on 09/15/2016 -----------------------------------------     I have reviewed the triage vital signs and the nursing notes.   HISTORY   Chief Complaint Facial Swelling    HPI William Gentry is a 46 y.o. male who presents to the ED for facial swelling. Patient claims swelling around his eyes after rubbing them I was outside today. Patient does have bilateral swelling, no difficulty breathing or swallowing, he's not had any skin rash or hives. Patient reports he's been outside playing softball but denies any other recent changes in his activities   Past Medical History:  Diagnosis Date  . Alcoholism in recovery [facility])    recovery since 2011  . Allergy   . Asthma   . Hypertension   . Wolff-Parkinson-White (WPW) syndrome     Patient Active Problem List   Diagnosis Date Noted  . Plantar fasciitis 09/07/2016  . Routine general medical examination at a health care facility 03/21/2016  . Urine frequency 12/07/2015  . Essential hypertension 03/25/2014  . Pure hypercholesterolemia 03/25/2014  . Prediabetes 03/25/2014  . Asthma 07/03/2012  . Rhinitis 07/03/2012    Past Surgical History:  Procedure Laterality Date  . CARDIAC SURGERY      Allergies Patient has no known allergies.  Social History Social History  Substance Use Topics  . Smoking status: Former Games developer  . Smokeless tobacco: Never Used  . Alcohol use No     Comment: former alcoholic, clean for four years (2015)    Review of Systems Constitutional: Negative for fever. Cardiovascular: Negative for chest pain. Respiratory: Negative for shortness of breath. Gastrointestinal: Negative for abdominal pain, vomiting and diarrhea. Genitourinary: Negative for dysuria. Musculoskeletal: Negative for back pain. Skin: positive for periorbital swelling Neurological: Negative for  headaches, focal weakness or numbness.  10-point ROS otherwise negative.  ____________________________________________   PHYSICAL EXAM:  VITAL SIGNS: ED Triage Vitals  Enc Vitals Group     BP 09/15/16 1742 (!) 143/91     Pulse Rate 09/15/16 1742 (!) 112     Resp 09/15/16 1742 18     Temp 09/15/16 1742 98.4 F (36.9 C)     Temp Source 09/15/16 1742 Oral     SpO2 09/15/16 1742 98 %     Weight 09/15/16 1738 277 lb (125.6 kg)     Height --      Head Circumference --      Peak Flow --      Pain Score 09/15/16 1738 0     Pain Loc --      Pain Edu? --      Excl. in GC? --    Constitutional: Alert and oriented. Well appearing and in no distress. Eyes: Conjunctivae are normal. PERRL. Normal extraocular movements. ENT   Head: Normocephalic and atraumatic.      Eyes: Patient with extensive periorbital edema bilaterally   Nose: No congestion/rhinnorhea.   Mouth/Throat: Mucous membranes are moist.   Neck: No stridor. Cardiovascular: Normal rate, regular rhythm. No murmurs, rubs, or gallops. Respiratory: Normal respiratory effort without tachypnea nor retractions. Breath sounds are clear and equal bilaterally. No wheezes/rales/rhonchi. Gastrointestinal: Soft and nontender. Normal bowel sounds Musculoskeletal: Nontender with normal range of motion in extremities. No lower extremity tenderness nor edema. Neurologic:  Normal speech and language. No gross focal neurologic deficits are appreciated.  Skin:  Skin is warm, dry and intact. No rash noted. No hives are appreciated  Psychiatric: Mood and affect are normal. Speech and behavior are normal.  ____________________________________________  ED COURSE:  Pertinent labs & imaging results that were available during my care of the patient were reviewed by me and considered in my medical decision making (see chart for details). Patient presents for periorbital swelling, patient has allergic type facial swelling possibly seasonal  due to pollen or grass. We will give oral Benadryl and steroids Clinical Course as of Sep 15 1853  Sat Sep 15, 2016  0981 Patient is doing well, swelling has somewhat improved. He'll be discharged with Benadryl and steroids and is stable for outpatient follow-up.  [JW]    Clinical Course User Index [JW] Emily Filbert, MD   Procedures  ____________________________________________  FINAL ASSESSMENT AND PLAN  Allergic reaction  Plan: Patient's labs and imaging were dictated above. Patient had presented for allergic type facial swelling, currently improved and will be discharged as dictated above.    Emily Filbert, MD   Note: This note was generated in part or whole with voice recognition software. Voice recognition is usually quite accurate but there are transcription errors that can and very often do occur. I apologize for any typographical errors that were not detected and corrected.     Emily Filbert, MD 09/15/16 [phone]

## 2016-09-15 NOTE — ED Triage Notes (Signed)
Pt to ed with c/o swelling to eyes after rubbing eyes today while outside. Pt with bilat eye swelling, no resp distress noted.

## 2016-09-25 ENCOUNTER — Other Ambulatory Visit: Payer: Self-pay

## 2016-09-25 MED ORDER — ALBUTEROL SULFATE HFA 108 (90 BASE) MCG/ACT IN AERS: INHALATION_SPRAY | RESPIRATORY_TRACT | 3 refills | Status: DC

## 2016-09-25 MED ORDER — ROSUVASTATIN CALCIUM 20 MG PO TABS: 20.0000 mg | ORAL_TABLET | Freq: Every day | ORAL | 3 refills | Status: DC

## 2016-09-25 NOTE — Telephone Encounter (Signed)
Refill sent to pharmacy. Patient needs follow-up scheduled. Thanks.

## 2016-09-25 NOTE — Telephone Encounter (Signed)
Last OV 03/21/16 last filled  crestor 03/23/16 90 3rf Ventolin 05/09/16 18g 3  Patient would like 90 days to opt[facility]rx

## 2016-09-26 NOTE — Telephone Encounter (Signed)
Left message to notify patient to schedule follow up

## 2016-09-26 NOTE — Telephone Encounter (Signed)
Left message to return call 

## 2016-11-06 ENCOUNTER — Telehealth: Payer: Self-pay | Admitting: Family Medicine

## 2016-11-06 NOTE — Telephone Encounter (Signed)
Last filled 09/25/16 18 g 3 rf last OV 03/21/16

## 2016-11-06 NOTE — Telephone Encounter (Signed)
Refill sent to pharmacy. Patient needs follow-up for his asthma. Thanks.

## 2016-11-07 NOTE — Telephone Encounter (Signed)
Left message to return call 

## 2016-11-08 NOTE — Telephone Encounter (Signed)
Patient was informed of Dr.Sonnenberg's statement, and was scheduled

## 2016-12-18 ENCOUNTER — Encounter: Payer: Self-pay | Admitting: Family Medicine

## 2016-12-18 ENCOUNTER — Ambulatory Visit (INDEPENDENT_AMBULATORY_CARE_PROVIDER_SITE_OTHER): Payer: 59 | Admitting: Family Medicine

## 2016-12-18 VITALS — BP 140/98 | HR 80 | Temp 99.0°F | Wt 271.4 lb

## 2016-12-18 DIAGNOSIS — I8393 Asymptomatic varicose veins of bilateral lower extremities: Secondary | ICD-10-CM | POA: Insufficient documentation

## 2016-12-18 DIAGNOSIS — I10 Essential (primary) hypertension: Secondary | ICD-10-CM

## 2016-12-18 DIAGNOSIS — J4520 Mild intermittent asthma, uncomplicated: Secondary | ICD-10-CM

## 2016-12-18 DIAGNOSIS — M722 Plantar fascial fibromatosis: Secondary | ICD-10-CM

## 2016-12-18 DIAGNOSIS — E7800 Pure hypercholesterolemia, unspecified: Secondary | ICD-10-CM | POA: Diagnosis not present

## 2016-12-18 MED ORDER — ROSUVASTATIN CALCIUM 20 MG PO TABS: 20.0000 mg | ORAL_TABLET | Freq: Every day | ORAL | 0 refills | Status: DC

## 2016-12-18 MED ORDER — ROSUVASTATIN CALCIUM 20 MG PO TABS: 20.0000 mg | ORAL_TABLET | Freq: Every day | ORAL | 3 refills | Status: DC

## 2016-12-18 MED ORDER — AMLODIPINE BESYLATE 5 MG PO TABS: 5.0000 mg | ORAL_TABLET | Freq: Every day | ORAL | 0 refills | Status: DC

## 2016-12-18 MED ORDER — AMLODIPINE BESYLATE 5 MG PO TABS: 5.0000 mg | ORAL_TABLET | Freq: Every day | ORAL | 3 refills | Status: DC

## 2016-12-18 NOTE — Assessment & Plan Note (Signed)
Well-controlled.  Continue as needed albuterol 

## 2016-12-18 NOTE — Patient Instructions (Signed)
Nice to see you. We will start you on amlodipine for your blood pressure. When you need a refill of your inhaler please let us know. Please stretch and ice your foot to help with your plantar fasciitis. We'll get you to see vascular surgery for your varicose veins. If you're unable to get them stop bleeding quickly please seek medical attention.

## 2016-12-18 NOTE — Assessment & Plan Note (Signed)
Uncontrolled. We'll start him on amlodipine. He'll return in a month for BP check and lab work.

## 2016-12-18 NOTE — Progress Notes (Signed)
[name], MD Phone: [phone]  William Gentry is a 46 y.o. male who presents today for follow-up.  Hypertension: Blood pressure typically running between 120-140/80-85. No chest pain, shortness breath, or edema.  Asthma: Using albuterol 1-2 times a week. Wheezing 1-2 times a week. No cough. No shortness of breath. No nighttime symptoms. Does note a few allergy symptoms with itchy watery eyes and some mild rhinorrhea. Uses allergy eyedrops to help with this.  Supposed to be on Crestor though has not gotten this recently.  Patient has varicose veins. Occasionally one of them in his right medial thigh will start to bleed. He is able to get this to stop bleeding with pressure. It has done this on for her so occasions. No pain.  Plantar fasciitis: Had improved quite a bit though over the last couple of weeks he has been on his feet more and it has started to bother him in the left posterior plantar fascial area.  PMH: Former smoker   ROS see history of present illness  Objective  Physical Exam Vitals:   12/18/16 1554  BP: (!) 140/98  Pulse: 80  Temp: 99 F (37.2 C)    BP Readings from Last 3 Encounters:  12/18/16 (!) 140/98  09/15/16 134/72  09/07/16 (!) 150/100   Wt Readings from Last 3 Encounters:  12/18/16 271 lb 6.4 oz (123.1 kg)  09/15/16 277 lb (125.6 kg)  09/07/16 277 lb 2 oz (125.7 kg)    Physical Exam  Constitutional: No distress.  HENT:  Head: Normocephalic and atraumatic.  Cardiovascular: Normal rate, regular rhythm and normal heart sounds.   Pulmonary/Chest: Effort normal and breath sounds normal.  Musculoskeletal:       Feet:  Varicose veins evident in bilateral thighs, no bleeding noted, no tenderness noted  Neurological: He is alert. Gait normal.  Skin: Skin is warm and dry. He is not diaphoretic.     Assessment/Plan: Please see individual problem list.  Essential hypertension Uncontrolled. We'll start him on amlodipine. He'll return  in a month for BP check and lab work.  Asthma Well-controlled. Continue as needed albuterol.  Plantar fasciitis Slight plantar fasciitis. Encouraged stretches and icing.  Pure hypercholesterolemia Start back on Crestor. He'll return in one month for recheck of LDL and liver function tests.  Varicose veins of both lower extremities Rare bleeding of varicose veins in his right lower thigh. no evidence of bleeding today or tenderness. Advised that if he is unable to get this to stop bleeding quickly when it does bleed needs to be evaluated in the emergency room. We'll get him to see vascular surgery.   Orders Placed This Encounter  Procedures  . Comp Met (CMET)    Standing Status:   Future    Standing Expiration Date:   12/18/2017  . LDL cholesterol, direct    Standing Status:   Future    Standing Expiration Date:   12/18/2017  . Ambulatory referral to Vascular Surgery    Referral Priority:   Routine    Referral Type:   Surgical    Referral Reason:   Specialty Services Required    Requested Specialty:   Vascular Surgery    Number of Visits Requested:   1    Meds ordered this encounter  Medications  . DISCONTD: rosuvastatin (CRESTOR) 20 MG tablet    Sig: Take 1 tablet (20 mg total) by mouth daily.    Dispense:  90 tablet    Refill:  0  . DISCONTD: rosuvastatin (CRESTOR)  20 MG tablet    Sig: Take 1 tablet (20 mg total) by mouth daily.    Dispense:  90 tablet    Refill:  3  . DISCONTD: amLODipine (NORVASC) 5 MG tablet    Sig: Take 1 tablet (5 mg total) by mouth daily.    Dispense:  90 tablet    Refill:  3  . rosuvastatin (CRESTOR) 20 MG tablet    Sig: Take 1 tablet (20 mg total) by mouth daily.    Dispense:  90 tablet    Refill:  0  . amLODipine (NORVASC) 5 MG tablet    Sig: Take 1 tablet (5 mg total) by mouth daily.    Dispense:  90 tablet    Refill:  0   [name], MD [place]

## 2016-12-18 NOTE — Assessment & Plan Note (Signed)
Slight plantar fasciitis. Encouraged stretches and icing.

## 2016-12-18 NOTE — Assessment & Plan Note (Signed)
Rare bleeding of varicose veins in his right lower thigh. no evidence of bleeding today or tenderness. Advised that if he is unable to get this to stop bleeding quickly when it does bleed needs to be evaluated in the emergency room. We'll get him to see vascular surgery.

## 2016-12-18 NOTE — Assessment & Plan Note (Signed)
Start back on Crestor. He'll return in one month for recheck of LDL and liver function tests.

## 2016-12-24 ENCOUNTER — Other Ambulatory Visit: Payer: Self-pay

## 2016-12-24 MED ORDER — ALBUTEROL SULFATE HFA 108 (90 BASE) MCG/ACT IN AERS: INHALATION_SPRAY | RESPIRATORY_TRACT | 0 refills | Status: DC

## 2016-12-24 NOTE — Telephone Encounter (Signed)
Last OV 12/18/16 last filled  11/06/16, patient is switching pharmacy to [facility]

## 2017-01-22 ENCOUNTER — Other Ambulatory Visit: Payer: 59

## 2017-01-24 ENCOUNTER — Ambulatory Visit (INDEPENDENT_AMBULATORY_CARE_PROVIDER_SITE_OTHER): Payer: Self-pay

## 2017-01-24 ENCOUNTER — Other Ambulatory Visit (INDEPENDENT_AMBULATORY_CARE_PROVIDER_SITE_OTHER): Payer: Self-pay

## 2017-01-24 VITALS — BP 122/90 | HR 81

## 2017-01-24 DIAGNOSIS — E7800 Pure hypercholesterolemia, unspecified: Secondary | ICD-10-CM

## 2017-01-24 DIAGNOSIS — I10 Essential (primary) hypertension: Secondary | ICD-10-CM

## 2017-01-24 NOTE — Progress Notes (Signed)
Patient comes in for 1 month  blood pressure check after starting amlodipine.  Patient states he has been checking blood pressure at home averaging 130-125 /85-80.  He has been complaint with taking blood pressure medication.    Reviewed.  Diastolic reading in office still a little elevated.  Please forward results to Dr Birdie SonsSonnenberg for f/u plans.    Dr Lorin PicketScott

## 2017-01-25 ENCOUNTER — Other Ambulatory Visit: Payer: Self-pay

## 2017-01-25 LAB — COMPREHENSIVE METABOLIC PANEL
ALBUMIN: 4.3 g/dL (ref 3.5–5.2)
ALT: 21 U/L (ref 0–53)
AST: 18 U/L (ref 0–37)
Alkaline Phosphatase: 48 U/L (ref 39–117)
BILIRUBIN TOTAL: 0.6 mg/dL (ref 0.2–1.2)
BUN: 10 mg/dL (ref 6–23)
CALCIUM: 9.9 mg/dL (ref 8.4–10.5)
CO2: 28 mEq/L (ref 19–32)
CREATININE: 0.85 mg/dL (ref 0.40–1.50)
Chloride: 103 mEq/L (ref 96–112)
GFR: 123.85 mL/min (ref >60.00)
Glucose, Bld: 76 mg/dL (ref 70–99)
Potassium: 3.9 mEq/L (ref 3.5–5.1)
SODIUM: 139 meq/L (ref 135–145)
Total Protein: 7.6 g/dL (ref 6.0–8.3)

## 2017-01-25 LAB — LDL CHOLESTEROL, DIRECT: LDL DIRECT: 90 mg/dL

## 2017-01-25 MED ORDER — ALBUTEROL SULFATE HFA 108 (90 BASE) MCG/ACT IN AERS: INHALATION_SPRAY | RESPIRATORY_TRACT | 0 refills | Status: DC

## 2017-01-25 NOTE — Progress Notes (Signed)
I would suggest increasing amlodipine to 10 mg daily. Follow-up for nurse visit in 2 weeks after change made.

## 2017-01-25 NOTE — Progress Notes (Signed)
Signed off on note.  Please forward information to Dr Birdie SonsSonnenberg for plans for blood pressure f/u given diastolic pressure still a little elevated (90).

## 2017-01-26 ENCOUNTER — Emergency Department: Payer: 59

## 2017-01-26 ENCOUNTER — Emergency Department
Admission: EM | Admit: 2017-01-26 | Discharge: 2017-01-26 | Disposition: A | Discharge status: Home / Self Care | Payer: 59 | Attending: Emergency Medicine | Admitting: Emergency Medicine

## 2017-01-26 ENCOUNTER — Encounter: Payer: Self-pay | Admitting: Emergency Medicine

## 2017-01-26 DIAGNOSIS — Z791 Long term (current) use of non-steroidal anti-inflammatories (NSAID): Secondary | ICD-10-CM | POA: Diagnosis not present

## 2017-01-26 DIAGNOSIS — Z79899 Other long term (current) drug therapy: Secondary | ICD-10-CM | POA: Insufficient documentation

## 2017-01-26 DIAGNOSIS — I10 Essential (primary) hypertension: Secondary | ICD-10-CM | POA: Diagnosis not present

## 2017-01-26 DIAGNOSIS — R2232 Localized swelling, mass and lump, left upper limb: Secondary | ICD-10-CM | POA: Diagnosis present

## 2017-01-26 DIAGNOSIS — Z711 Person with feared health complaint in whom no diagnosis is made: Secondary | ICD-10-CM | POA: Diagnosis not present

## 2017-01-26 DIAGNOSIS — J45909 Unspecified asthma, uncomplicated: Secondary | ICD-10-CM | POA: Diagnosis not present

## 2017-01-26 DIAGNOSIS — Z87891 Personal history of nicotine dependence: Secondary | ICD-10-CM | POA: Insufficient documentation

## 2017-01-26 DIAGNOSIS — M7989 Other specified soft tissue disorders: Secondary | ICD-10-CM | POA: Diagnosis not present

## 2017-01-26 NOTE — ED Provider Notes (Signed)
[facility] Emergency Department Provider Note  ____________________________________________  Time seen: Approximately 8:15 PM  I have reviewed the triage vital signs and the nursing notes.   HISTORY  Chief Complaint Joint Swelling    HPI William Gentry is a 46 y.o. male presenting to the emergency department with concern regarding the prominence made by the left ulnar styloid. Patient states that left ulnar styloid "used to be smaller". He reports no pain. He denies weakness, radiculopathy, changes in sensation, changes in grip strength, fever, chills, night sweats, recent weight gain or weight loss or knowledge of current malignancy. Patient denies any trauma to the area.   Past Medical History:  Diagnosis Date  . Alcoholism in recovery [facility])    recovery since 2011  . Allergy   . Asthma   . Hypertension   . Wolff-Parkinson-White (WPW) syndrome     Patient Active Problem List   Diagnosis Date Noted  . Varicose veins of both lower extremities 12/18/2016  . Plantar fasciitis 09/07/2016  . Routine general medical examination at a health care facility 03/21/2016  . Urine frequency 12/07/2015  . Essential hypertension 03/25/2014  . Pure hypercholesterolemia 03/25/2014  . Prediabetes 03/25/2014  . Asthma 07/03/2012  . Rhinitis 07/03/2012    Past Surgical History:  Procedure Laterality Date  . CARDIAC SURGERY      Prior to Admission medications   Medication Sig Start Date End Date Taking? Authorizing Provider  albuterol (VENTOLIN HFA) 108 (90 Base) MCG/ACT inhaler INHALE 2 PUFFS INTO THE LUNGS EVERY 6 HOURS AS NEEDED FOR WHEEZING OR SHORTNESS OF BREATH 01/25/17   Glori Luis, MD  amLODipine (NORVASC) 5 MG tablet Take 1 tablet (5 mg total) by mouth daily. 12/18/16   Glori Luis, MD  diclofenac (VOLTAREN) 75 MG EC tablet Take 1 tablet (75 mg total) by mouth 2 (two) times daily as needed. 09/07/16   Tommie Sams, DO  diphenhydrAMINE  (BENADRYL) 25 mg capsule Take 1 capsule (25 mg total) by mouth every 4 (four) hours as needed. 09/15/16 09/15/17  Emily Filbert, MD  rosuvastatin (CRESTOR) 20 MG tablet Take 1 tablet (20 mg total) by mouth daily. 12/18/16   Glori Luis, MD    Allergies Patient has no known allergies.  Family History  Problem Relation Age of Onset  . Cancer Mother        brain cancer  . Lupus Mother     Social History Social History  Substance Use Topics  . Smoking status: Former Games developer  . Smokeless tobacco: Never Used  . Alcohol use No     Comment: former alcoholic, clean for four years (2015)     Review of Systems  Constitutional: No fever/chills Eyes: No visual changes. No discharge ENT: No upper respiratory complaints. Cardiovascular: no chest pain. Respiratory: no cough. No SOB. Gastrointestinal: No abdominal pain.   Musculoskeletal: Negative for musculoskeletal pain. Skin: Negative for rash, abrasions, lacerations, ecchymosis. Neurological: Negative for headaches, focal weakness or numbness.  ____________________________________________   PHYSICAL EXAM:  VITAL SIGNS: ED Triage Vitals  Enc Vitals Group     BP 01/26/17 2007 (!) 145/91     Pulse Rate 01/26/17 2007 88     Resp 01/26/17 2007 18     Temp 01/26/17 2007 98.7 F (37.1 C)     Temp Source 01/26/17 2007 Oral     SpO2 01/26/17 2007 97 %     Weight 01/26/17 2006 270 lb (122.5 kg)     Height  01/26/17 2006 6' (1.829 m)     Head Circumference --      Peak Flow --      Pain Score 01/26/17 2005 0     Pain Loc --      Pain Edu? --      Excl. in GC? --      Constitutional: Alert and oriented. Well appearing and in no acute distress. Eyes: Conjunctivae are normal. PERRL. EOMI. Head: Atraumatic. Cardiovascular: Normal rate, regular rhythm. Normal S1 and S2.  Good peripheral circulation. Respiratory: Normal respiratory effort without tachypnea or retractions. Lungs CTAB. Good air entry to the bases with no  decreased or absent breath sounds. Musculoskeletal: Full range of motion to all extremities. No gross deformities appreciated. Prominence made by left ulnar styloid appears to be larger than the right. Palpable radial pulse, left. Neurologic:  Normal speech and language. No gross focal neurologic deficits are appreciated.  Skin:  Skin is warm, dry and intact. No rash noted. ____________________________________________   LABS (all labs ordered are listed, but only abnormal results are displayed)  Labs Reviewed - No data to display ____________________________________________  EKG   ____________________________________________  RADIOLOGY Geraldo PitterI, Nolin Grell M Dewanda Fennema, personally viewed and evaluated these images (plain radiographs) as part of my medical decision making, as well as reviewing the written report by the radiologist.    Dg Wrist Complete Left  Result Date: 01/26/2017 CLINICAL DATA:  Lump on left wrist EXAM: LEFT WRIST - COMPLETE 3+ VIEW COMPARISON:  None. FINDINGS: No fracture or malalignment. Soft tissue thickening over the dorsum of the lower wrist. No abnormal soft tissue calcification. IMPRESSION: No acute osseous abnormality Electronically Signed   By: Jasmine PangKim  Fujinaga M.D.   On: 01/26/2017 20:59    ____________________________________________    PROCEDURES  Procedure(s) performed:    Procedures    Medications - No data to display   ____________________________________________   INITIAL IMPRESSION / ASSESSMENT AND PLAN / ED COURSE  Pertinent labs & imaging results that were available during my care of the patient were reviewed by me and considered in my medical decision making (see chart for details).  Review of the [place] CSRS was performed in accordance of the [facility] prior to dispensing any controlled drugs.     Assessment and Plan: Feared diagnosis without complaint. Patient presents to the emergency department with concern regarding the prominence made by left  ulnar styloid. X-ray examination conducted in the emergency department was unremarkable. Patient reports no pain or discomfort. Patient was advised to follow up with orthopedics to consider further nonemergent evaluation. All patient questions were answered.    ____________________________________________  FINAL CLINICAL IMPRESSION(S) / ED DIAGNOSES  Final diagnoses:  Feared complaint without diagnosis      NEW MEDICATIONS STARTED DURING THIS VISIT:  New Prescriptions   No medications on file        This chart was dictated using voice recognition software/Dragon. Despite best efforts to proofread, errors can occur which can change the meaning. Any change was purely unintentional.    Gasper LloydWoods, Hazem Kenner M, PA-C 01/26/17 2143    Sharman CheekStafford, Phillip, MD 01/26/17 2326

## 2017-01-26 NOTE — ED Notes (Signed)

## 2017-01-26 NOTE — ED Triage Notes (Signed)
Pt left work around 1400 this evening at which time he noticed a lump on his left wrist. Pt is ambulatory to triage, denies pain or injury, and is in NAD at this time.

## 2017-01-29 NOTE — Progress Notes (Signed)
Spoke with patient and advised of below. He states he went to ER on Saturday due to he thought he broke wrist and BP at hospital 120/77 they checked it twice .  He is still on   Amlodipine 5 mg .  He wants to continue to monitor blood pressure at home and let us know how it is running.

## 2017-01-29 NOTE — Progress Notes (Signed)
Seems to be well controlled [facility]

## 2017-01-30 NOTE — Progress Notes (Signed)
Patient advised of below and verbalized understanding.  

## 2017-02-05 ENCOUNTER — Ambulatory Visit (INDEPENDENT_AMBULATORY_CARE_PROVIDER_SITE_OTHER): Payer: 59 | Admitting: Vascular Surgery

## 2017-02-05 ENCOUNTER — Encounter (INDEPENDENT_AMBULATORY_CARE_PROVIDER_SITE_OTHER): Payer: Self-pay | Admitting: Vascular Surgery

## 2017-02-05 VITALS — BP 139/96 | HR 74 | Resp 16 | Ht 72.0 in | Wt 279.0 lb

## 2017-02-05 DIAGNOSIS — E7800 Pure hypercholesterolemia, unspecified: Secondary | ICD-10-CM

## 2017-02-05 DIAGNOSIS — R7303 Prediabetes: Secondary | ICD-10-CM

## 2017-02-05 DIAGNOSIS — I8393 Asymptomatic varicose veins of bilateral lower extremities: Secondary | ICD-10-CM | POA: Diagnosis not present

## 2017-02-05 NOTE — Progress Notes (Signed)
Subjective:    Patient ID: William Gentry, male    DOB: 09/22/1970, 46 y.o.   MRN: [id number] Chief Complaint  Patient presents with  . Varicose Veins   Patient presents at the request of Dr. Birdie SonsSonnenberg for bilateral lower extremity varicose veins. Patient informs dealing with these varicose veins for "many years". Patient states a "a few" episodes of bleeding from a cluster of varicose veins located on the medial aspect of the right thigh. She states he will hold pressure and the bleeding will stop. Patient denies any claudication, rest pain or ulceration to his lower extremity. At the current time the patient does not wear compression stockings or elevate his legs heart level or higher. He denies any episodes of cellulitis. Left bleeding episode was a few weeks ago.   Review of Systems  Constitutional: Negative.   HENT: Negative.   Eyes: Negative.   Respiratory: Negative.   Cardiovascular: Negative.   Gastrointestinal: Negative.   Endocrine: Negative.   Genitourinary: Negative.   Musculoskeletal: Negative.   Skin:       Varicose veins  Allergic/Immunologic: Negative.   Neurological: Negative.   Hematological: Negative.   Psychiatric/Behavioral: Negative.       Objective:   Physical Exam  Constitutional: He is oriented to person, place, and time. He appears well-developed and well-nourished. No distress.  HENT:  Head: Normocephalic and atraumatic.  Eyes: Pupils are equal, round, and reactive to light. Conjunctivae are normal.  Neck: Normal range of motion.  Cardiovascular: Normal rate, regular rhythm, normal heart sounds and intact distal pulses.   Pulses:      Radial pulses are 2+ on the right side, and 2+ on the left side.       Dorsalis pedis pulses are 2+ on the right side, and 2+ on the left side.       Posterior tibial pulses are 2+ on the right side, and 2+ on the left side.  Pulmonary/Chest: Effort normal.  Musculoskeletal: Normal range of motion. He exhibits  edema (Mild edema bilaterally).  Neurological: He is alert and oriented to person, place, and time.  Skin: Skin is warm and dry. He is not diaphoretic.  diffuse spider veins noted bilaterally  Psychiatric: He has a normal mood and affect. His behavior is normal. Judgment and thought content normal.  Vitals reviewed.   BP (!) 139/96   Pulse 74   Resp 16   Ht 6' (1.829 m)   Wt 279 lb (126.6 kg)   BMI 37.84 kg/m   Past Medical History:  Diagnosis Date  . Alcoholism in recovery [facility](HCC)    recovery since 2011  . Allergy   . Asthma   . Hypertension   . Wolff-Parkinson-White (WPW) syndrome     Social History   Social History  . Marital status: Married    Spouse name: N/A  . Number of children: N/A  . Years of education: N/A   Occupational History  . Not on file.   Social History Main Topics  . Smoking status: Former Games developermoker  . Smokeless tobacco: Never Used  . Alcohol use No     Comment: former alcoholic, clean for four years (2015)  . Drug use: No  . Sexual activity: Yes   Other Topics Concern  . Not on file   Social History Narrative   Marital status: married      Children: 4 girls; 2 grandchildren      Lives: with wife, 2 youngest (9,7)  Employment: [employer]      Tobacco: quit smoking; smoked x 7 years.      Alcohol: quit drinking 4 years ago; heavy.      Drugs: none      Exercise:  trying    Past Surgical History:  Procedure Laterality Date  . CARDIAC SURGERY      Family History  Problem Relation Age of Onset  . Cancer Mother        brain cancer  . Lupus Mother     No Known Allergies     Assessment & Plan:  Patient presents at the request of Dr. Birdie Sons for bilateral lower extremity varicose veins. Patient informs dealing with these varicose veins for "many years". Patient states a "a few" episodes of bleeding from a cluster of varicose veins located on the medial aspect of the right thigh. She states he will hold pressure and the  bleeding will stop. Patient denies any claudication, rest pain or ulceration to his lower extremity. At the current time the patient does not wear compression stockings or elevate his legs heart level or higher. He denies any episodes of cellulitis. Left bleeding episode was a few weeks ago.  1. Varicose veins of both lower extremities - new The patient was encouraged to wear graduated compression stockings (20-30 mmHg) on a daily basis. The patient was instructed to begin wearing the stockings first thing in the morning and removing them in the evening. The patient was instructed specifically not to sleep in the stockings. Prescription In addition, behavioral modification including elevation during the day will be initiated. Anti-inflammatories for pain. Patient to return in 3 months for a formal workup for reflux  - VAS [place] LOWER EXTREMITY VENOUS REFLUX; Future  2. Prediabetes - stable Encouraged good control as its slows the progression of atherosclerotic disease  3. Pure hypercholesterolemia - stable Encouraged good control as its slows the progression of atherosclerotic disease  Current Outpatient Prescriptions on File Prior to Visit  Medication Sig Dispense Refill  . albuterol (VENTOLIN HFA) 108 (90 Base) MCG/ACT inhaler INHALE 2 PUFFS INTO THE LUNGS EVERY 6 HOURS AS NEEDED FOR WHEEZING OR SHORTNESS OF BREATH 18 g 0  . amLODipine (NORVASC) 5 MG tablet Take 1 tablet (5 mg total) by mouth daily. 90 tablet 0  . diclofenac (VOLTAREN) 75 MG EC tablet Take 1 tablet (75 mg total) by mouth 2 (two) times daily as needed. 30 tablet 0  . diphenhydrAMINE (BENADRYL) 25 mg capsule Take 1 capsule (25 mg total) by mouth every 4 (four) hours as needed. 30 capsule 2  . rosuvastatin (CRESTOR) 20 MG tablet Take 1 tablet (20 mg total) by mouth daily. 90 tablet 0   No current facility-administered medications on file prior to visit.     There are no Patient Instructions on file for this visit. No  Follow-up on file.   Renner Sebald A Hara Milholland, PA-C

## 2017-02-25 ENCOUNTER — Other Ambulatory Visit: Payer: Self-pay | Admitting: Family Medicine

## 2017-03-15 ENCOUNTER — Encounter (INDEPENDENT_AMBULATORY_CARE_PROVIDER_SITE_OTHER): Payer: Self-pay

## 2017-03-15 ENCOUNTER — Ambulatory Visit (INDEPENDENT_AMBULATORY_CARE_PROVIDER_SITE_OTHER): Payer: 59

## 2017-03-15 ENCOUNTER — Ambulatory Visit (INDEPENDENT_AMBULATORY_CARE_PROVIDER_SITE_OTHER): Payer: 59 | Admitting: Vascular Surgery

## 2017-04-05 ENCOUNTER — Other Ambulatory Visit: Payer: Self-pay | Admitting: Family Medicine

## 2017-04-12 ENCOUNTER — Telehealth: Payer: Self-pay | Admitting: Family Medicine

## 2017-04-12 ENCOUNTER — Other Ambulatory Visit: Payer: Self-pay

## 2017-04-12 MED ORDER — ALBUTEROL SULFATE HFA 108 (90 BASE) MCG/ACT IN AERS: INHALATION_SPRAY | RESPIRATORY_TRACT | 0 refills | Status: DC

## 2017-04-12 NOTE — Telephone Encounter (Signed)
Pt's wife Suzette BattiestVeronica called because she [facility] [place] [place] prescription but was told that the prescription was not there; will call pharmacies listed on profile and contact wife with findings; she can be reached at [phone]

## 2017-04-12 NOTE — Telephone Encounter (Signed)
Contacted [name] at [facility]; made aware of events; she will [facility] [place]; pt's wife [name] notified

## 2017-05-09 ENCOUNTER — Other Ambulatory Visit: Payer: Self-pay | Admitting: Family Medicine

## 2017-05-30 ENCOUNTER — Telehealth: Payer: Self-pay | Admitting: Family Medicine

## 2017-05-30 MED ORDER — ALBUTEROL SULFATE HFA 108 (90 BASE) MCG/ACT IN AERS: 2.0000 | INHALATION_SPRAY | Freq: Four times a day (QID) | RESPIRATORY_TRACT | 1 refills | Status: DC | PRN

## 2017-05-30 NOTE — Telephone Encounter (Signed)
Please advise 

## 2017-05-30 NOTE — Telephone Encounter (Signed)
Sent to pharmacy 

## 2017-05-30 NOTE — Telephone Encounter (Signed)
Pt is requesting a refill on albuterol.

## 2017-05-30 NOTE — Telephone Encounter (Signed)
Copied from CRM [phone]. Topic: Quick Communication - See Telephone Encounter >> May 30, 2017  2:01 PM Diana EvesHoyt, Maryann B wrote: CRM for notification. See Telephone encounter for:  Pt calling needing refill on albuterol  05/30/17.

## 2017-05-30 NOTE — Telephone Encounter (Signed)
Last OV 12/18/16 last filled 05/09/17 1 0rf

## 2017-08-19 ENCOUNTER — Other Ambulatory Visit: Payer: Self-pay | Admitting: Family Medicine

## 2017-08-20 NOTE — Telephone Encounter (Signed)
Last OV 12/18/16 last filled 05/30/17 1 1rf

## 2017-08-20 NOTE — Telephone Encounter (Signed)
Sent to pharmacy.  Patient needs a follow-up visit scheduled.  Thanks.

## 2017-08-21 NOTE — Telephone Encounter (Signed)
Left message to return call,ok for pec to speak to patient and schedule follow up appointment

## 2017-09-06 ENCOUNTER — Encounter: Payer: Self-pay | Admitting: *Deleted

## 2017-09-06 NOTE — Telephone Encounter (Signed)
Mailed letter °

## 2017-09-11 ENCOUNTER — Other Ambulatory Visit: Payer: Self-pay | Admitting: Family Medicine

## 2017-10-10 ENCOUNTER — Other Ambulatory Visit: Payer: Self-pay | Admitting: Family Medicine

## 2017-10-17 ENCOUNTER — Encounter: Payer: Self-pay | Admitting: Family Medicine

## 2017-11-15 ENCOUNTER — Telehealth: Payer: Self-pay | Admitting: Family Medicine

## 2017-11-15 MED ORDER — ALBUTEROL SULFATE HFA 108 (90 BASE) MCG/ACT IN AERS: 2.0000 | INHALATION_SPRAY | Freq: Four times a day (QID) | RESPIRATORY_TRACT | 0 refills | Status: DC | PRN

## 2017-11-15 NOTE — Telephone Encounter (Signed)
Copied from CRM [id number]. Topic: Quick Communication - See Telephone Encounter >> Nov 15, 2017 11:16 AM Jolayne Hainesaylor, Brittany L wrote: CRM for notification. See Telephone encounter for: 11/15/17.  Patient said he needs a new script on albuterol (PROVENTIL HFA;VENTOLIN HFA) 108 (90 Base) MCG/ACT inhaler. He said the last two times that he went to get this from the pharmacy they gave him the " red " inhaler instead of the " blue ". He said that the red inhaler gets clogged up. Please advise.   Call back @ [phone]   [id number] [place],  - [address] [place] [phone]

## 2017-11-15 NOTE — Telephone Encounter (Signed)
I have sent a refill into the patients pharmacy.  Unfortunately I do not have a reason for why the inhaler changed.  They may have changed the supplier of the medication.  He should discuss this with his pharmacist to determine if there is a change that could be made.  He has not been seen in 11 months.  He needs to schedule a follow-up if he is going to continue to receive medication refills.  Thanks.

## 2017-11-15 NOTE — Telephone Encounter (Signed)
Please advise 

## 2017-11-16 ENCOUNTER — Other Ambulatory Visit: Payer: Self-pay | Admitting: Family Medicine

## 2017-11-18 NOTE — Telephone Encounter (Signed)
Relation to pt: self  Call back number: [phone]   Reason for call:  Patient informed of message below and voice understanding, [facility]

## 2017-11-18 NOTE — Telephone Encounter (Signed)
Left message to return call, ok for pec to speak to patient about message below and schedule follow up appointment with Dr.Sonnenberg

## 2017-12-30 ENCOUNTER — Other Ambulatory Visit: Payer: Self-pay | Admitting: Family Medicine

## 2017-12-31 ENCOUNTER — Other Ambulatory Visit: Payer: Self-pay | Admitting: Family Medicine

## 2017-12-31 NOTE — Telephone Encounter (Signed)
Last OV 12/18/16 last filled 11/15/17 1 0rf

## 2017-12-31 NOTE — Telephone Encounter (Signed)
Sent to pharmacy.  Patient has an appointment later this month.

## 2018-01-12 ENCOUNTER — Other Ambulatory Visit: Payer: Self-pay | Admitting: Family Medicine

## 2018-01-24 ENCOUNTER — Encounter: Payer: Self-pay | Admitting: Family Medicine

## 2018-01-24 ENCOUNTER — Ambulatory Visit: Payer: 59 | Admitting: Family Medicine

## 2018-01-24 VITALS — BP 138/98 | HR 82 | Temp 98.6°F | Ht 72.0 in | Wt 274.4 lb

## 2018-01-24 DIAGNOSIS — R5382 Chronic fatigue, unspecified: Secondary | ICD-10-CM

## 2018-01-24 DIAGNOSIS — J4530 Mild persistent asthma, uncomplicated: Secondary | ICD-10-CM | POA: Diagnosis not present

## 2018-01-24 DIAGNOSIS — E7800 Pure hypercholesterolemia, unspecified: Secondary | ICD-10-CM

## 2018-01-24 DIAGNOSIS — I10 Essential (primary) hypertension: Secondary | ICD-10-CM | POA: Diagnosis not present

## 2018-01-24 NOTE — Patient Instructions (Signed)
Nice to see you. We will have you return for lab work. We will check with our clinical pharmacist regarding an inhaler for you to use for your asthma.

## 2018-01-25 DIAGNOSIS — R5382 Chronic fatigue, unspecified: Secondary | ICD-10-CM | POA: Insufficient documentation

## 2018-01-25 NOTE — Progress Notes (Signed)
[name], MD Phone: [phone]  William Gentry is a 47 y.o. male who presents today for f/u.  CC: hld, htn, asthma, fatigue  HYPERTENSION  Disease Monitoring  Home BP Monitoring 120130/85-90 Chest pain- no    Dyspnea-see asthma Medications  Compliance-  Taking amlodipine.  Edema- no  HYPERLIPIDEMIA Symptoms Chest pain on exertion:  no   Medications: Compliance- taking crestor Right upper quadrant pain- no  Muscle aches- no  Asthma: Patient reports using his albuterol once a day or once every other day.  In the past he was on Advair though has not been on that in some time.  He notes no coughing.  Occasional wheezing.  Mild dyspnea that responds to his albuterol.  He would be willing to start on an additional inhaler.  Decreased energy: Patient notes he just does not have the energy he used to.  No night sweats or weight loss.  He only gets 4 hours of sleep per night though that has been a chronic occurrence for some time.  He wakes well rested.  No depression or anxiety.  He does note some erectile dysfunction.   Social History   Tobacco Use  Smoking Status Former Smoker  Smokeless Tobacco Never Used     ROS see history of present illness  Objective  Physical Exam Vitals:   01/24/18 1534  BP: (!) 138/98  Pulse: 82  Temp: 98.6 F (37 C)  SpO2: 95%    BP Readings from Last 3 Encounters:  01/24/18 (!) 138/98  02/05/17 (!) 139/96  01/26/17 127/77   Wt Readings from Last 3 Encounters:  01/24/18 274 lb 6.4 oz (124.5 kg)  02/05/17 279 lb (126.6 kg)  01/26/17 270 lb (122.5 kg)    Physical Exam  Constitutional: No distress.  HENT:  Mouth/Throat: Oropharynx is clear and moist. No oropharyngeal exudate.  Neck: Neck supple.  Cardiovascular: Normal rate, regular rhythm and normal heart sounds.  Pulmonary/Chest: Effort normal and breath sounds normal.  Genitourinary:  Genitourinary Comments: Normal penis, normal scrotum, normal testicles, normal vas  deferens, normal epididymis, no inguinal hernias  Musculoskeletal: He exhibits no edema.  Lymphadenopathy:    He has no cervical adenopathy.  Neurological: He is alert.  Skin: Skin is warm and dry. He is not diaphoretic.     Assessment/Plan: Please see individual problem list.  Essential hypertension Currently controlled at home.  He will continue to monitor and if it starts to rise above 130/90 we would consider increasing amlodipine.  Asthma Chronic issue.  It sounds as though he is using albuterol more frequently than we would prefer.  Discussed adding an inhaled corticosteroid.  Patient is concerned about cost.  I will forward our clinical pharmacist to look and see what is covered by his insurance.  Pure hypercholesterolemia Continue current medication.  Chronic fatigue Chronic decreased energy.  He does note some erectile dysfunction which could indicate a testosterone issue.  We will check lab work next week and then determine the next step in management.    Orders Placed This Encounter  Procedures  . Comp Met (CMET)    Standing Status:   Future    Standing Expiration Date:   01/25/2019  . TSH    Standing Status:   Future    Standing Expiration Date:   01/25/2019  . B12    Standing Status:   Future    Standing Expiration Date:   01/25/2019  . Vitamin D (25 hydroxy)    Standing Status:   Future  Standing Expiration Date:   01/25/2019  . Testosterone    Standing Status:   Future    Standing Expiration Date:   01/25/2019  . CBC w/Diff    Standing Status:   Future    Standing Expiration Date:   01/25/2019    No orders of the defined types were placed in this encounter.    [name], MD [place]

## 2018-01-25 NOTE — Assessment & Plan Note (Addendum)
Currently controlled at home.  He will continue to monitor and if it starts to rise above 130/90 we would consider increasing amlodipine.

## 2018-01-25 NOTE — Assessment & Plan Note (Signed)
Chronic issue.  It sounds as though he is using albuterol more frequently than we would prefer.  Discussed adding an inhaled corticosteroid.  Patient is concerned about cost.  I will forward our clinical pharmacist to look and see what is covered by his insurance.

## 2018-01-25 NOTE — Assessment & Plan Note (Signed)
Chronic decreased energy.  He does note some erectile dysfunction which could indicate a testosterone issue.  We will check lab work next week and then determine the next step in management.

## 2018-01-25 NOTE — Assessment & Plan Note (Signed)
Continue current medication.

## 2018-01-27 ENCOUNTER — Other Ambulatory Visit: Payer: Self-pay | Admitting: Family Medicine

## 2018-01-30 ENCOUNTER — Other Ambulatory Visit: Payer: Self-pay | Admitting: *Deleted

## 2018-01-30 ENCOUNTER — Other Ambulatory Visit: Payer: Self-pay | Admitting: Family Medicine

## 2018-01-30 ENCOUNTER — Other Ambulatory Visit (INDEPENDENT_AMBULATORY_CARE_PROVIDER_SITE_OTHER): Payer: 59

## 2018-01-30 ENCOUNTER — Telehealth: Payer: Self-pay | Admitting: Family Medicine

## 2018-01-30 DIAGNOSIS — R5382 Chronic fatigue, unspecified: Secondary | ICD-10-CM

## 2018-01-30 LAB — VITAMIN B12: Vitamin B-12: 290 pg/mL (ref 211–911)

## 2018-01-30 LAB — CBC WITH DIFFERENTIAL/PLATELET
Basophils Absolute: 0 10*3/uL (ref 0.0–0.1)
Basophils Relative: 0.3 % (ref 0.0–3.0)
EOS PCT: 2 % (ref 0.0–5.0)
Eosinophils Absolute: 0.1 10*3/uL (ref 0.0–0.7)
HEMATOCRIT: 41.3 % (ref 39.0–52.0)
Hemoglobin: 13.8 g/dL (ref 13.0–17.0)
LYMPHS PCT: 25.9 % (ref 12.0–46.0)
Lymphs Abs: 1.7 10*3/uL (ref 0.7–4.0)
MCHC: 33.5 g/dL (ref 30.0–36.0)
MCV: 86.1 fl (ref 78.0–100.0)
MONOS PCT: 6.1 % (ref 3.0–12.0)
Monocytes Absolute: 0.4 10*3/uL (ref 0.1–1.0)
Neutro Abs: 4.2 10*3/uL (ref 1.4–7.7)
Neutrophils Relative %: 65.7 % (ref 43.0–77.0)
Platelets: 267 10*3/uL (ref 150.0–400.0)
RBC: 4.8 Mil/uL (ref 4.22–5.81)
RDW: 14.1 % (ref 11.5–15.5)
WBC: 6.4 10*3/uL (ref 4.0–10.5)

## 2018-01-30 LAB — COMPREHENSIVE METABOLIC PANEL
ALBUMIN: 4.4 g/dL (ref 3.5–5.2)
ALT: 20 U/L (ref 0–53)
AST: 19 U/L (ref 0–37)
Alkaline Phosphatase: 48 U/L (ref 39–117)
BUN: 10 mg/dL (ref 6–23)
CHLORIDE: 103 meq/L (ref 96–112)
CO2: 31 mEq/L (ref 19–32)
CREATININE: 0.85 mg/dL (ref 0.40–1.50)
Calcium: 10.2 mg/dL (ref 8.4–10.5)
GFR: 123.32 mL/min (ref >60.00)
GLUCOSE: 106 mg/dL — AB (ref 70–99)
POTASSIUM: 4.7 meq/L (ref 3.5–5.1)
SODIUM: 141 meq/L (ref 135–145)
Total Bilirubin: 0.6 mg/dL (ref 0.2–1.2)
Total Protein: 7.8 g/dL (ref 6.0–8.3)

## 2018-01-30 LAB — TSH: TSH: 0.81 u[IU]/mL (ref 0.35–4.50)

## 2018-01-30 LAB — VITAMIN D 25 HYDROXY (VIT D DEFICIENCY, FRACTURES): VITD: 34.52 ng/mL (ref 30.00–100.00)

## 2018-01-30 LAB — TESTOSTERONE: Testosterone: 316.64 ng/dL (ref 300.00–890.00)

## 2018-01-30 MED ORDER — MOMETASONE FUROATE 100 MCG/ACT IN AERO: 200.0000 ug | INHALATION_SPRAY | Freq: Two times a day (BID) | RESPIRATORY_TRACT | 2 refills | Status: DC

## 2018-01-30 NOTE — Telephone Encounter (Signed)
Copied from CRM [id number]. Topic: Quick Communication - Rx Refill/Question >> Jan 30, 2018  4:11 PM Luanna Cole wrote: Medication: Mometasone Furoate [facility] HFA) 100 MCG/ACT AERO [[id number]] sent in to wrong pharmacy   Has the patient contacted their pharmacy? No Preferred Pharmacy (with phone number or street name): [id number] [place] [place] - [address] [phone] (Phone) [phone] (Fax)   Agent: Please be advised that RX refills may take up to 3 business days. We ask that you follow-up with your pharmacy.

## 2018-01-30 NOTE — Telephone Encounter (Signed)
Mometasone Furoate  MCG/ACT AERO sent to wrong pharmacy. [phone]

## 2018-01-30 NOTE — Telephone Encounter (Signed)
asmanex hfa sent to patient's pharmacy.  I will forward back to Catie to contact the pharmacy with the coupon.

## 2018-01-30 NOTE — Telephone Encounter (Signed)
-----   Message from Lourena Simmonds, [facility] sent at 01/28/2018 10:32 AM EDT ----- Hi!  I'm having a hard time finding any [employer] Formulary with his particular plan name on it [employer]), but it looks like Qvar Redihaler, Alvesco, and Asmanex HFA are the preferred ICS medications for other [facility] plans. It looks like both Asmanex and Qvar have $15/month coupon cards for commercially insured patients. Asmanex HFA works exactly the same as his albuterol HFA, so that might be the easiest transition. If you wanted to prescribe that, I can call the pharmacy and verbally give them the coupon information and see what his copay would be.   Just let me know what you need from me!  Catie   ----- Message ----- From: Glori Luis, MD Sent: 01/25/2018   2:27 PM EDT To: Lourena Simmonds, [facility]  Hey Catie,   I wanted to see if you could look at this patients insurance and find out what inhaled corticosteroids are covered and potential costs. The patient is concerned about cost and has been using his albuterol too frequently. Thanks.   Minerva Areola

## 2018-01-30 NOTE — Telephone Encounter (Signed)
Downloaded Asthmanex coupon card from [employer]. Called in information to pharmacy. They did not have patient's updated insurance information on file. I faxed copy of insurance card to [place], as well as coupon card information.    Catie Feliz Beam, PharmD PGY2 Ambulatory Care Pharmacy Resident Phone: [phone]

## 2018-01-31 NOTE — Telephone Encounter (Signed)
Had faxed Asmanex HFA coupon card and patient insurance to [facility] in [place] yesterday. Called the correct pharmacy ([facility] in [place]) to ensure they were able to run the patient's insurance and coupon card. Pharmacist informed me that the copay for the inhaler was $10.    Left HIPAA compliant message letting patient know the medication is ready for pick up at [facility] in [place], and to contact office or my cell with any medication questions.   Catie Feliz Beam, PharmD PGY2 Ambulatory Care Pharmacy Resident Phone: [phone]

## 2018-02-12 DIAGNOSIS — M9903 Segmental and somatic dysfunction of lumbar region: Secondary | ICD-10-CM | POA: Diagnosis not present

## 2018-02-12 DIAGNOSIS — M9902 Segmental and somatic dysfunction of thoracic region: Secondary | ICD-10-CM | POA: Diagnosis not present

## 2018-02-12 DIAGNOSIS — R293 Abnormal posture: Secondary | ICD-10-CM | POA: Diagnosis not present

## 2018-02-18 DIAGNOSIS — M9902 Segmental and somatic dysfunction of thoracic region: Secondary | ICD-10-CM | POA: Diagnosis not present

## 2018-02-18 DIAGNOSIS — R293 Abnormal posture: Secondary | ICD-10-CM | POA: Diagnosis not present

## 2018-02-18 DIAGNOSIS — M9903 Segmental and somatic dysfunction of lumbar region: Secondary | ICD-10-CM | POA: Diagnosis not present

## 2018-02-23 ENCOUNTER — Other Ambulatory Visit: Payer: Self-pay | Admitting: Family Medicine

## 2018-02-24 DIAGNOSIS — M9903 Segmental and somatic dysfunction of lumbar region: Secondary | ICD-10-CM | POA: Diagnosis not present

## 2018-02-24 DIAGNOSIS — M9902 Segmental and somatic dysfunction of thoracic region: Secondary | ICD-10-CM | POA: Diagnosis not present

## 2018-02-24 DIAGNOSIS — R293 Abnormal posture: Secondary | ICD-10-CM | POA: Diagnosis not present

## 2018-03-03 DIAGNOSIS — M9902 Segmental and somatic dysfunction of thoracic region: Secondary | ICD-10-CM | POA: Diagnosis not present

## 2018-03-03 DIAGNOSIS — M9903 Segmental and somatic dysfunction of lumbar region: Secondary | ICD-10-CM | POA: Diagnosis not present

## 2018-03-03 DIAGNOSIS — R293 Abnormal posture: Secondary | ICD-10-CM | POA: Diagnosis not present

## 2018-03-13 ENCOUNTER — Ambulatory Visit: Payer: 59 | Admitting: Mental Health

## 2018-03-13 DIAGNOSIS — F4323 Adjustment disorder with mixed anxiety and depressed mood: Secondary | ICD-10-CM | POA: Diagnosis not present

## 2018-03-13 NOTE — Progress Notes (Signed)
      [facility] Counselor/Therapist Progress Note   Patient ID: William Gentry, MRN: [id number]  Date: 03/17/2018  Timespent: 60 minutes  Treatment Type: Individual  Subjective: Patient arrived on time for today's session patient consented to have one-to-one session to discuss progress.  Facilitated discussion regarding the relationship and recent stressors.  Patient shared how he continues to feel that his wife will not move past his history of infidelity which was about 1 decade ago.  They discussed this in detail related to her learning about his being a father to another child and her not knowing this information for a number of years.  She expressed needing more quality time from him.  Patient identified how he feels that he is being judged and how he often feels disconnected from wanting more quality time with her.  They are given homework assignment to identify specific dialects of each lobe language of the other and follow-through daily with a weekly check and as well as making concerted efforts around hugging once a day as they stated they have had less physical contact for quite some time.  Both were agreeable to homework.  Joint coupled by identifying strengths noticed in session today.  Interventions:Strength-based and Supportive  Mental Status Exam:   Appearance:   Casual     Behavior:  Appropriate  Motor:  Normal  Speech/Language:   Clear and Coherent  Affect:  Appropriate  Mood:  normal  Thought process:  Coherent and Relevant  Thought content:    WDL  Perceptual disturbances:    Normal  Orientation:  Full (Time, Place, and Person)  Attention:  Good  Concentration:  good  Memory:  Immediate  Fund of knowledge:   Good  Insight:    Good  Judgment:   Good  Impulse Control:  good    Reported Symptoms: Depressed mood some days, fatigue, low motivation at times,  subsequent anxiety related to marital relationship.  Risk Assessment: Danger to Self:  No Self-injurious  Behavior: No Danger to Others: No Duty to Warn:no Physical Aggression / Violence:No  Access to Firearms a concern: No  Gang Involvement:No   Diagnosis:   ICD-10-CM   1. Adjustment disorder with mixed anxiety and depressed mood F43.23      Plan:   1.  Patient to continue to engage in individual counseling 2-4 times a month or as needed. 2.  Patient to identify and apply CBT, coping skills learned in session to decrease depression and anxiety symptoms. 3.  Patient to improve communication patient to improve self expression and communication with his wife to improve their marital relationship. 4.  Patient to contact this office, go to the local ED or call 911 if a crisis or emergency develops between visits.  [name] Session, [facility]

## 2018-03-31 DIAGNOSIS — M9903 Segmental and somatic dysfunction of lumbar region: Secondary | ICD-10-CM | POA: Diagnosis not present

## 2018-03-31 DIAGNOSIS — M9902 Segmental and somatic dysfunction of thoracic region: Secondary | ICD-10-CM | POA: Diagnosis not present

## 2018-03-31 DIAGNOSIS — R293 Abnormal posture: Secondary | ICD-10-CM | POA: Diagnosis not present

## 2018-04-01 ENCOUNTER — Ambulatory Visit (INDEPENDENT_AMBULATORY_CARE_PROVIDER_SITE_OTHER): Payer: 59 | Admitting: Mental Health

## 2018-04-01 ENCOUNTER — Other Ambulatory Visit: Payer: Self-pay | Admitting: Family Medicine

## 2018-04-01 DIAGNOSIS — F4323 Adjustment disorder with mixed anxiety and depressed mood: Secondary | ICD-10-CM

## 2018-04-01 NOTE — Progress Notes (Signed)
      [facility] Counselor/Therapist Progress Note   Patient ID: William Gentry, MRN: [id number]  Date: 04/01/2018  Timespent: 60 minutes  Treatment Type: Individual  Subjective: Patient arrived on time for today's session with his wife.  Discussed progress in their relationship.  They stated that they had a pleasant trip recently to the [place], as they went there for work related needs.  He shared how they were able to spend some quality time with one another.  Wife stated she followed through on identifying her lobe language between sessions, going on to share how she is trying to make efforts toward showing her husband her support and love for him.  Husband stated that he recognized her efforts and was appreciative.  Husband struggled to follow through on identifying his language between sessions but stated that he will do so before our next visit.  Assisted them in improving their communication during the session, giving examples and engaging in role play.  Interventions:Strength-based and Supportive  Mental Status Exam:   Appearance:   Casual     Behavior:  Appropriate  Motor:  Normal  Speech/Language:   Clear and Coherent  Affect:  Appropriate  Mood:  normal  Thought process:  Coherent and Relevant  Thought content:    WDL  Perceptual disturbances:    Normal  Orientation:  Full (Time, Place, and Person)  Attention:  Good  Concentration:  good  Memory:  Immediate  Fund of knowledge:   Good  Insight:    Good  Judgment:   Good  Impulse Control:  good    Reported Symptoms: Depressed mood some days, fatigue, low motivation at times,  subsequent anxiety related to marital relationship.  Risk Assessment: Danger to Self:  No Self-injurious Behavior: No Danger to Others: No Duty to Warn:no Physical Aggression / Violence:No  Access to Firearms a concern: No  Gang Involvement:No   Diagnosis: No diagnosis found.   Plan:   1.  Patient to continue to engage in individual  counseling 2-4 times a month or as needed. 2.  Patient to identify and apply CBT, coping skills learned in session to decrease depression and anxiety symptoms. 3.  Patient to improve communication patient to improve self expression and communication with his wife to improve their marital relationship. 4.  Patient to contact this office, go to the local ED or call 911 if a crisis or emergency develops between visits.  [name] Session, [facility]

## 2018-04-07 DIAGNOSIS — R293 Abnormal posture: Secondary | ICD-10-CM | POA: Diagnosis not present

## 2018-04-07 DIAGNOSIS — M9902 Segmental and somatic dysfunction of thoracic region: Secondary | ICD-10-CM | POA: Diagnosis not present

## 2018-04-07 DIAGNOSIS — M9903 Segmental and somatic dysfunction of lumbar region: Secondary | ICD-10-CM | POA: Diagnosis not present

## 2018-04-10 ENCOUNTER — Telehealth: Payer: Self-pay

## 2018-04-10 MED ORDER — ALBUTEROL SULFATE HFA 108 (90 BASE) MCG/ACT IN AERS: 1.0000 | INHALATION_SPRAY | Freq: Four times a day (QID) | RESPIRATORY_TRACT | 0 refills | Status: DC | PRN

## 2018-04-10 NOTE — Telephone Encounter (Signed)
[phone] [place] pharmacist called , received script for ventolin inhaler need clarify directions on inhaler , no directions given when they received script  . Looked back on previous scripts in medication history that Dr Birdie SonsSonnenberg had prescribed  and gave verbal inhale 2 puffs every 6 hours as needed.

## 2018-04-10 NOTE — Telephone Encounter (Signed)
Agree with instructions provided.

## 2018-04-11 DIAGNOSIS — M9902 Segmental and somatic dysfunction of thoracic region: Secondary | ICD-10-CM | POA: Diagnosis not present

## 2018-04-11 DIAGNOSIS — M9903 Segmental and somatic dysfunction of lumbar region: Secondary | ICD-10-CM | POA: Diagnosis not present

## 2018-04-11 DIAGNOSIS — R293 Abnormal posture: Secondary | ICD-10-CM | POA: Diagnosis not present

## 2018-04-14 DIAGNOSIS — M9902 Segmental and somatic dysfunction of thoracic region: Secondary | ICD-10-CM | POA: Diagnosis not present

## 2018-04-14 DIAGNOSIS — M9903 Segmental and somatic dysfunction of lumbar region: Secondary | ICD-10-CM | POA: Diagnosis not present

## 2018-04-14 DIAGNOSIS — R293 Abnormal posture: Secondary | ICD-10-CM | POA: Diagnosis not present

## 2018-04-29 ENCOUNTER — Ambulatory Visit (INDEPENDENT_AMBULATORY_CARE_PROVIDER_SITE_OTHER): Payer: 59 | Admitting: Mental Health

## 2018-04-29 DIAGNOSIS — F4323 Adjustment disorder with mixed anxiety and depressed mood: Secondary | ICD-10-CM | POA: Diagnosis not present

## 2018-04-29 NOTE — Progress Notes (Signed)
      [facility] Counselor/Therapist Progress Note   Patient ID: William Gentry, MRN: [id number]  Date: 04/29/2018  Timespent: 53 minutes  Treatment Type: Individual  Subjective: Patient arrived on time for today's session.  He shared recent marital stress, how his wife he typically tries to accompany him to his therapy appointments decided not to come today.  He stated that she told him that she is "tired of trying and you not trying enough".  Patient shared recent experiences in their marriage.  He stated that over the last week, she has been more upset and agitated, at times focusing on his child with him they both want contact.  He stated that he again, does not have control over his his child's mother's decisions.  He stated that also, his wife has taken the time to identify where she works and has talked to her supervisor in an effort to motivate her to allow them to see his child.  Patient stated that he did not agree with this decision, and as an example of her being overly assertive in his opinion, which could affect his opportunity to meet his son at some point.  He shared more history, relating to when they had more significant marital problems about 8 years ago.  He stated that although she often focuses on his infidelity, he shared a situation in which he is highly certain that she was with someone else.  He stated that when trying to discuss this however she avoids it and he remains the focus as is the case and many of their disagreements.  Patient stated that she often has issues centered around control, giving examples.    Interventions:Strength-based and Supportive  Mental Status Exam:   Appearance:   Casual     Behavior:  Appropriate  Motor:  Normal  Speech/Language:   Clear and Coherent  Affect:  Appropriate  Mood:  normal  Thought process:  Coherent and Relevant  Thought content:    WDL  Perceptual disturbances:    Normal  Orientation:  Full (Time, Place, and Person)   Attention:  Good  Concentration:  good  Memory:  Immediate  Fund of knowledge:   Good  Insight:    Good  Judgment:   Good  Impulse Control:  good    Reported Symptoms: Depressed mood some days, fatigue, low motivation at times,  subsequent anxiety related to marital relationship.  Risk Assessment: Danger to Self:  No Self-injurious Behavior: No Danger to Others: No Duty to Warn:no Physical Aggression / Violence:No  Access to Firearms a concern: No  Gang Involvement:No   Diagnosis:   ICD-10-CM   1. Adjustment disorder with mixed anxiety and depressed mood F43.23      Plan:   1.  Patient to continue to engage in individual counseling 2-4 times a month or as needed. 2.  Patient to identify and apply CBT, coping skills learned in session to decrease depression and anxiety symptoms. 3.  Patient to improve communication patient to improve self expression and communication with his wife to improve their marital relationship. 4.  Patient to contact this office, go to the local ED or call 911 if a crisis or emergency develops between visits.  [name] Sessionhristopher Marthann Abshier, [facility]

## 2018-05-08 ENCOUNTER — Telehealth: Payer: Self-pay

## 2018-05-08 NOTE — Telephone Encounter (Signed)
He would not be able to use that as a physical for his job.  It was not billed as a physical and a physical was not completed.

## 2018-05-08 NOTE — Telephone Encounter (Signed)
Copied from CRM [id number]. Topic: General - Other >> May 08, 2018 12:36 PM Stephannie LiSimmons, Janett L, NT wrote: Reason for CRM: Patient called and would like to know if he can use his visit on 12/3017 and the labs on 01/30/18  as a physical for his job he says he needs the paperwork signed . Please call him at  [phone]

## 2018-05-08 NOTE — Telephone Encounter (Signed)
Sent to PCP please advise OV in */2019 was for chronic fatigue not an CPE

## 2018-05-08 NOTE — Telephone Encounter (Signed)
Called and spoke with patient. Pt advised and voiced understanding. Will go to urgent care he stated for his CPE.

## 2018-05-09 DIAGNOSIS — Z0000 Encounter for general adult medical examination without abnormal findings: Secondary | ICD-10-CM | POA: Diagnosis not present

## 2018-05-09 DIAGNOSIS — I10 Essential (primary) hypertension: Secondary | ICD-10-CM | POA: Diagnosis not present

## 2018-05-09 DIAGNOSIS — E7800 Pure hypercholesterolemia, unspecified: Secondary | ICD-10-CM | POA: Diagnosis not present

## 2018-05-09 DIAGNOSIS — E559 Vitamin D deficiency, unspecified: Secondary | ICD-10-CM | POA: Diagnosis not present

## 2018-05-19 ENCOUNTER — Ambulatory Visit: Payer: 59 | Admitting: Mental Health

## 2018-06-02 ENCOUNTER — Ambulatory Visit: Payer: 59 | Admitting: Family Medicine

## 2018-06-02 DIAGNOSIS — Z0289 Encounter for other administrative examinations: Secondary | ICD-10-CM

## 2018-06-11 ENCOUNTER — Ambulatory Visit (INDEPENDENT_AMBULATORY_CARE_PROVIDER_SITE_OTHER): Payer: 59 | Admitting: Mental Health

## 2018-06-11 DIAGNOSIS — F4323 Adjustment disorder with mixed anxiety and depressed mood: Secondary | ICD-10-CM

## 2018-06-22 NOTE — Progress Notes (Signed)
      [facility] Counselor/Therapist Progress Note   Patient ID: William Gentry, MRN: [id number]  Date: 06/11/18  Timespent: 56 minutes  Treatment Type: family therapy  Subjective: Patient arrived on time for today's session with his wife.  Continue to work with family discussing recent communication.  Wife stated that she feels disconnected from patient going on to share details, feelings.  Assisted both in communication identifying needs.  They continue to consider looking at the 5 love languages, specifically patient who, at this point has not identified his language to share with his wife.  Both agreed to follow through on this resource and to discuss the relationship and progress at least 1 time per week.  Interventions:Strength-based and Supportive  Mental Status Exam:   Appearance:   Casual     Behavior:  Appropriate  Motor:  Normal  Speech/Language:   Clear and Coherent  Affect:  Appropriate  Mood:  normal  Thought process:  Coherent and Relevant  Thought content:    WDL  Perceptual disturbances:    Normal  Orientation:  Full (Time, Place, and Person)  Attention:  Good  Concentration:  good  Memory:  Immediate  Fund of knowledge:   Good  Insight:    Good  Judgment:   Good  Impulse Control:  good    Reported Symptoms: Depressed mood some days, fatigue, low motivation at times,  subsequent anxiety related to marital relationship.  Risk Assessment: Danger to Self:  No Self-injurious Behavior: No Danger to Others: No Duty to Warn:no Physical Aggression / Violence:No  Access to Firearms a concern: No  Gang Involvement:No   Diagnosis:   ICD-10-CM   1. Adjustment disorder with mixed anxiety and depressed mood F43.23      Plan:   1.  Patient to continue to engage in individual counseling 2-4 times a month or as needed. 2.  Patient to identify and apply CBT, coping skills learned in session to decrease depression and anxiety symptoms. 3.  Patient to improve  communication patient to improve self expression and communication with his wife to improve their marital relationship. 4.  Patient to contact this office, go to the local ED or call 911 if a crisis or emergency develops between visits.  [name] Session, [facility]

## 2018-07-02 ENCOUNTER — Ambulatory Visit: Payer: 59 | Admitting: Mental Health

## 2018-07-04 ENCOUNTER — Other Ambulatory Visit: Payer: Self-pay | Admitting: Family Medicine

## 2018-07-04 ENCOUNTER — Ambulatory Visit: Payer: 59 | Admitting: Mental Health

## 2018-07-23 ENCOUNTER — Other Ambulatory Visit: Payer: Self-pay | Admitting: Family Medicine

## 2018-07-23 MED ORDER — ALBUTEROL SULFATE HFA 108 (90 BASE) MCG/ACT IN AERS: 1.0000 | INHALATION_SPRAY | Freq: Four times a day (QID) | RESPIRATORY_TRACT | 0 refills | Status: DC | PRN

## 2018-07-23 NOTE — Telephone Encounter (Signed)
Copied from CRM [id number]. Topic: Quick Communication - Rx Refill/Question >> Jul 23, 2018  5:25 PM Mickel Baas B, NT wrote: Medication: albuterol (VENTOLIN HFA) 108 (90 Base) MCG/ACT inhaler  Has the patient contacted their pharmacy? Yes.   (Agent: If no, request that the patient contact the pharmacy for the refill.) (Agent: If yes, when and what did the pharmacy advise?)  Preferred Pharmacy (with phone number or street name): [id number] - [place], [place] - [address]  Agent: Please be advised that RX refills may take up to 3 business days. We ask that you follow-up with your pharmacy.

## 2018-07-24 ENCOUNTER — Ambulatory Visit (INDEPENDENT_AMBULATORY_CARE_PROVIDER_SITE_OTHER): Payer: 59 | Admitting: Mental Health

## 2018-07-24 DIAGNOSIS — F4323 Adjustment disorder with mixed anxiety and depressed mood: Secondary | ICD-10-CM | POA: Diagnosis not present

## 2018-07-24 NOTE — Progress Notes (Signed)
      [facility] Counselor/Therapist Progress Note   Patient ID: William Gentry, MRN: [id number]  Date: 07/24/18  Timespent: 60 minutes  Treatment Type: family therapy  Subjective:          Interventions:Strength-based and Supportive  Mental Status Exam:   Appearance:   Casual     Behavior:  Appropriate  Motor:  Normal  Speech/Language:   Clear and Coherent  Affect:  Appropriate  Mood:  normal  Thought process:  Coherent and Relevant  Thought content:    WDL  Perceptual disturbances:    Normal  Orientation:  Full (Time, Place, and Person)  Attention:  Good  Concentration:  good  Memory:  Immediate  Fund of knowledge:   Good  Insight:    Good  Judgment:   Good  Impulse Control:  good    Reported Symptoms: Depressed mood some days, fatigue, low motivation at times,  subsequent anxiety related to marital relationship.  Risk Assessment: Danger to Self:  No Self-injurious Behavior: No Danger to Others: No Duty to Warn:no Physical Aggression / Violence:No  Access to Firearms a concern: No  Gang Involvement:No   Patient / guardian was educated about steps to take if suicide or homicide risk level increases between visits. While future psychiatric events cannot be accurately predicted, the patient does not currently require acute inpatient psychiatric care and does not currently meet [facility] involuntary commitment criteria.  Subjective: Patient arrived on time for today's session.  He continued to share the struggle he is having in his marriage.  He shared his efforts, attempts to communicate as well as working to speak his wife;s "love language" and was identified in previous sessions.  At this point, patient is hopeful his marriage will continue, however, he shared the struggle he feels related to his wife's critical talk that not only he endorsed but there are children at times from his wife.  Patient shared more relevant history related to his marriage, some  significant stressors in the past, unresolved issues remain.  Patient shared his attempts to process through these issues, however his wife continues to mention them no matter how often or how much they have spoken about them.  Some ways to continue to communicate effectively were discussed.  Patient was agreeable to follow through between sessions.   Interventions: CBT, supportive therapy, communication strategies  Diagnosis:   ICD-10-CM   1. Adjustment disorder with mixed anxiety and depressed mood F43.23      Plan:   1.  Patient to continue to engage in individual counseling 2-4 times a month or as needed. 2.  Patient to identify and apply CBT, coping skills learned in session to decrease depression and anxiety symptoms. 3.  Patient to improve communication patient to improve self expression and communication with his wife to improve their marital relationship. 4.  Patient to contact this office, go to the local ED or call 911 if a crisis or emergency develops between visits.  [name] Session, [facility]

## 2018-07-25 ENCOUNTER — Telehealth: Payer: Self-pay | Admitting: Family Medicine

## 2018-07-25 ENCOUNTER — Telehealth: Payer: Self-pay

## 2018-07-25 MED ORDER — MOMETASONE FUROATE 100 MCG/ACT IN AERO: 200.0000 ug | INHALATION_SPRAY | Freq: Two times a day (BID) | RESPIRATORY_TRACT | 2 refills | Status: DC

## 2018-07-25 NOTE — Telephone Encounter (Deleted)
Please see note pt needs a refill for medication of albuterol (VENTOLIN HFA) 108 (90 Base) MCG/ACT inhaler

## 2018-07-25 NOTE — Telephone Encounter (Signed)
Pt needs a refill for Mometasone Furoate (ASMANEX HFA) 100 MCG/ACT AERO.   [id number] [place], [place] - [address]  Thank you!

## 2018-07-27 NOTE — Telephone Encounter (Signed)
Patient needs follow-up in the office.  Refill has already been given.  Please contact him to set up follow-up.

## 2018-07-28 NOTE — Telephone Encounter (Signed)
Called and LM on voicemail stating his medication was sent to the pharmacy and he needs to call back and make a follow up appt with the provider.  William Gentry

## 2018-08-12 ENCOUNTER — Other Ambulatory Visit: Payer: Self-pay | Admitting: Family Medicine

## 2018-08-18 ENCOUNTER — Ambulatory Visit (INDEPENDENT_AMBULATORY_CARE_PROVIDER_SITE_OTHER): Payer: 59 | Admitting: Mental Health

## 2018-08-18 ENCOUNTER — Other Ambulatory Visit: Payer: Self-pay

## 2018-08-18 ENCOUNTER — Ambulatory Visit: Payer: 59 | Admitting: Mental Health

## 2018-08-18 DIAGNOSIS — F4323 Adjustment disorder with mixed anxiety and depressed mood: Secondary | ICD-10-CM | POA: Diagnosis not present

## 2018-08-19 ENCOUNTER — Ambulatory Visit: Payer: 59 | Admitting: Mental Health

## 2018-08-31 NOTE — Progress Notes (Signed)
      [facility] Counselor/Therapist Progress Note   Patient ID: William Gentry, MRN: [id number]  Date: 08/18/18  Timespent: 55 minutes  Treatment Type: family therapy  Mental Status Exam:   Appearance:   Casual     Behavior:  Appropriate  Motor:  Normal  Speech/Language:   Clear and Coherent  Affect:  Appropriate  Mood:  normal  Thought process:  Coherent and Relevant  Thought content:    WDL  Perceptual disturbances:    Normal  Orientation:  Full (Time, Place, and Person)  Attention:  Good  Concentration:  good  Memory:  Immediate  Fund of knowledge:   Good  Insight:    Good  Judgment:   Good  Impulse Control:  good    Reported Symptoms: Depressed mood some days, fatigue, low motivation at times,  subsequent anxiety related to marital relationship.  Risk Assessment: Danger to Self:  No Self-injurious Behavior: No Danger to Others: No Duty to Warn:no Physical Aggression / Violence:No  Access to Firearms a concern: No  Gang Involvement:No   Patient / guardian was educated about steps to take if suicide or homicide risk level increases between visits. While future psychiatric events cannot be accurately predicted, the patient does not currently require acute inpatient psychiatric care and does not currently meet [facility] involuntary commitment criteria.  Subjective: Patient and wife arrived on time for today's session.  Patient and wife shared progress.  They shared how they continue to have marital stress, going on to spend time sharing their specific views.  Assisted them in identifying unmet needs.  Why the patient admitted that she needs to work on her frustration, anger and was open to considering individual counseling.  Joined patient and wife and identifying strengths and efforts recently.  To continue between sessions.  Patient was agreeable to follow through between sessions.  Interventions: CBT, supportive therapy, communication strategies  Diagnosis:  ICD-10-CM   1. Adjustment disorder with mixed anxiety and depressed mood F43.23      Plan:   1.  Patient to continue to engage in individual counseling 2-4 times a month or as needed. 2.  Patient to identify and apply CBT, coping skills learned in session to decrease depression and anxiety symptoms. 3.  Patient to improve communication patient to improve self expression and communication with his wife to improve their marital relationship. 4.  Patient to contact this office, go to the local ED or call 911 if a crisis or emergency develops between visits.  [name] Session, [facility]

## 2018-09-15 ENCOUNTER — Other Ambulatory Visit: Payer: Self-pay

## 2018-09-15 ENCOUNTER — Ambulatory Visit: Payer: 59 | Admitting: Mental Health

## 2018-09-20 ENCOUNTER — Other Ambulatory Visit: Payer: Self-pay | Admitting: Family Medicine

## 2018-10-08 ENCOUNTER — Other Ambulatory Visit: Payer: Self-pay | Admitting: Family Medicine

## 2018-10-17 ENCOUNTER — Other Ambulatory Visit: Payer: Self-pay | Admitting: Family Medicine

## 2018-11-17 ENCOUNTER — Other Ambulatory Visit: Payer: Self-pay | Admitting: Family Medicine

## 2018-12-22 ENCOUNTER — Other Ambulatory Visit: Payer: Self-pay | Admitting: Family Medicine

## 2019-01-18 ENCOUNTER — Other Ambulatory Visit: Payer: Self-pay | Admitting: Family Medicine

## 2019-02-04 ENCOUNTER — Other Ambulatory Visit: Payer: Self-pay | Admitting: Family Medicine

## 2019-02-20 ENCOUNTER — Encounter: Payer: Self-pay | Admitting: Family Medicine

## 2019-02-20 ENCOUNTER — Ambulatory Visit: Payer: 59 | Admitting: Family Medicine

## 2019-02-20 ENCOUNTER — Other Ambulatory Visit: Payer: Self-pay

## 2019-02-20 DIAGNOSIS — I10 Essential (primary) hypertension: Secondary | ICD-10-CM

## 2019-02-20 DIAGNOSIS — R7303 Prediabetes: Secondary | ICD-10-CM

## 2019-02-20 DIAGNOSIS — E7800 Pure hypercholesterolemia, unspecified: Secondary | ICD-10-CM

## 2019-02-20 DIAGNOSIS — J4530 Mild persistent asthma, uncomplicated: Secondary | ICD-10-CM | POA: Diagnosis not present

## 2019-02-20 MED ORDER — BUDESONIDE-FORMOTEROL FUMARATE 80-4.5 MCG/ACT IN AERO: 2.0000 | INHALATION_SPRAY | Freq: Two times a day (BID) | RESPIRATORY_TRACT | 3 refills | Status: DC

## 2019-02-20 NOTE — Patient Instructions (Signed)
Nice to see you. Please start checking her blood pressure daily.  We will have you return for nurse blood pressure check in about a month. We will call you with your lab results. If the Symbicort inhaler is too expensive please let us know.

## 2019-02-20 NOTE — Progress Notes (Signed)
  [name], MD Phone: [phone]  William Gentry is a 48 y.o. male who presents today for f/u.   HYPERTENSION  Disease Monitoring  Home BP Monitoring not checking Chest pain- no    Dyspnea- no Medications  Compliance-  Taking amlodipine.   Edema- no  HYPERLIPIDEMIA Symptoms Chest pain on exertion:  no  Medications: Compliance- taking crestor Right upper quadrant pain- no  Muscle aches- no  Asthma: Medication compliance- taking albuterol 1-2x weekly, insurance will not cover asmanex anymore   Rescue inhaler use- 1-2x weekly Dyspnea- no   Wheezing- no   Cough- no   Night time symptoms- no    Social History   Tobacco Use  Smoking Status Former Smoker  Smokeless Tobacco Never Used     ROS see history of present illness  Objective  Physical Exam Vitals:   02/20/19 1448 02/20/19 1508  BP: (!) 141/90 130/90  Pulse: 75   Temp: (!) 97.5 F (36.4 C)   SpO2: 98%     BP Readings from Last 3 Encounters:  02/20/19 130/90  01/24/18 (!) 138/98  02/05/17 (!) 139/96   Wt Readings from Last 3 Encounters:  02/20/19 280 lb 12.8 oz (127.4 kg)  01/24/18 274 lb 6.4 oz (124.5 kg)  02/05/17 279 lb (126.6 kg)    Physical Exam Constitutional:      General: He is not in acute distress.    Appearance: He is not diaphoretic.  Cardiovascular:     Rate and Rhythm: Normal rate and regular rhythm.     Heart sounds: Normal heart sounds.  Pulmonary:     Effort: Pulmonary effort is normal.     Breath sounds: Normal breath sounds.  Musculoskeletal:     Right lower leg: No edema.     Left lower leg: No edema.  Skin:    General: Skin is warm and dry.  Neurological:     Mental Status: He is alert.      Assessment/Plan: Please see individual problem list.  Essential hypertension Borderline today.  I have encouraged him to start checking it at home.  We will have him follow-up in a month for BP check with nursing.  Asthma Very well controlled on Asmanex though his  insurance will no longer cover this.  We will send in Symbicort for him to start on.  Prediabetes Check A1c.  Pure hypercholesterolemia Continue Crestor.  Check lipid panel.   Orders Placed This Encounter  Procedures  . Comp Met (CMET)  . Lipid panel  . HgB A1c    Meds ordered this encounter  Medications  . budesonide-formoterol (SYMBICORT) 80-4.5 MCG/ACT inhaler    Sig: Inhale 2 puffs into the lungs 2 (two) times daily.    Dispense:  1 Inhaler    Refill:  [place], MD [place]

## 2019-02-20 NOTE — Assessment & Plan Note (Signed)
Continue Crestor. Check lipid panel.  

## 2019-02-20 NOTE — Assessment & Plan Note (Signed)
Very well controlled on Asmanex though his insurance will no longer cover this.  We will send in Symbicort for him to start on.

## 2019-02-20 NOTE — Assessment & Plan Note (Signed)
Check A1c. 

## 2019-02-20 NOTE — Assessment & Plan Note (Signed)
Borderline today.  I have encouraged him to start checking it at home.  We will have him follow-up in a month for BP check with nursing.

## 2019-02-21 LAB — COMPREHENSIVE METABOLIC PANEL
AG Ratio: 1.6 (calc) (ref 1.0–2.5)
ALT: 19 U/L (ref 9–46)
AST: 17 U/L (ref 10–40)
Albumin: 4.2 g/dL (ref 3.6–5.1)
Alkaline phosphatase (APISO): 45 U/L (ref 36–130)
BUN: 11 mg/dL (ref 7–25)
CO2: 26 mmol/L (ref 20–32)
Calcium: 9.4 mg/dL (ref 8.6–10.3)
Chloride: 105 mmol/L (ref 98–110)
Creat: 0.81 mg/dL (ref 0.60–1.35)
Globulin: 2.6 g/dL (calc) (ref 1.9–3.7)
Glucose, Bld: 62 mg/dL — ABNORMAL LOW (ref 65–99)
Potassium: 4.3 mmol/L (ref 3.5–5.3)
Sodium: 141 mmol/L (ref 135–146)
Total Bilirubin: 0.3 mg/dL (ref 0.2–1.2)
Total Protein: 6.8 g/dL (ref 6.1–8.1)

## 2019-02-21 LAB — HEMOGLOBIN A1C
Hgb A1c MFr Bld: 5.8 % of total Hgb — ABNORMAL HIGH (ref <5.7)
Mean Plasma Glucose: 120 (calc)
eAG (mmol/L): 6.6 (calc)

## 2019-02-21 LAB — LIPID PANEL
Cholesterol: 145 mg/dL (ref <200)
HDL: 35 mg/dL — ABNORMAL LOW (ref >=40)
LDL Cholesterol (Calc): 92 mg/dL (calc)
Non-HDL Cholesterol (Calc): 110 mg/dL (calc) (ref <130)
Total CHOL/HDL Ratio: 4.1 (calc) (ref <5.0)
Triglycerides: 89 mg/dL (ref <150)

## 2019-02-28 ENCOUNTER — Other Ambulatory Visit: Payer: Self-pay | Admitting: Family Medicine

## 2019-03-05 ENCOUNTER — Other Ambulatory Visit: Payer: Self-pay | Admitting: Family Medicine

## 2019-03-05 DIAGNOSIS — E162 Hypoglycemia, unspecified: Secondary | ICD-10-CM

## 2019-03-13 ENCOUNTER — Other Ambulatory Visit: Payer: Self-pay

## 2019-03-13 ENCOUNTER — Other Ambulatory Visit (INDEPENDENT_AMBULATORY_CARE_PROVIDER_SITE_OTHER): Payer: 59

## 2019-03-13 DIAGNOSIS — E162 Hypoglycemia, unspecified: Secondary | ICD-10-CM | POA: Diagnosis not present

## 2019-03-14 LAB — GLUCOSE, FASTING: Glucose, Plasma: 104 mg/dL — ABNORMAL HIGH (ref 65–99)

## 2019-03-16 IMAGING — DX DG WRIST COMPLETE 3+V*L*
4 series · 4 of 4 positions shown · non-contrast
Comparison: None.

CLINICAL DATA: Lump on left wrist

EXAM:
LEFT WRIST - COMPLETE 3+ VIEW

[wrist ap (1 of 2)]
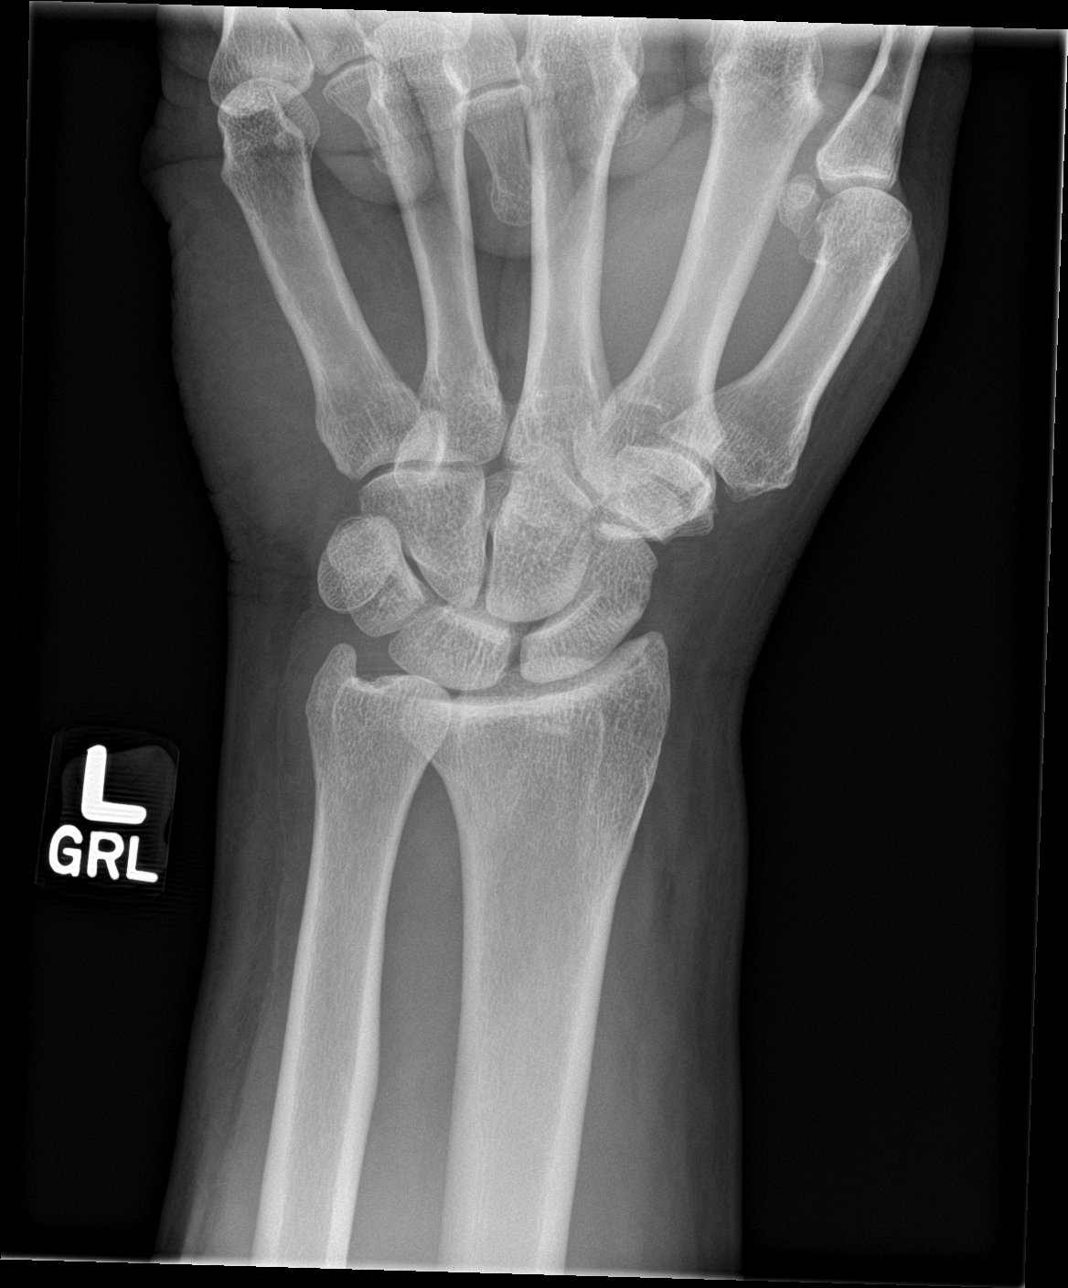

[wrist obl]
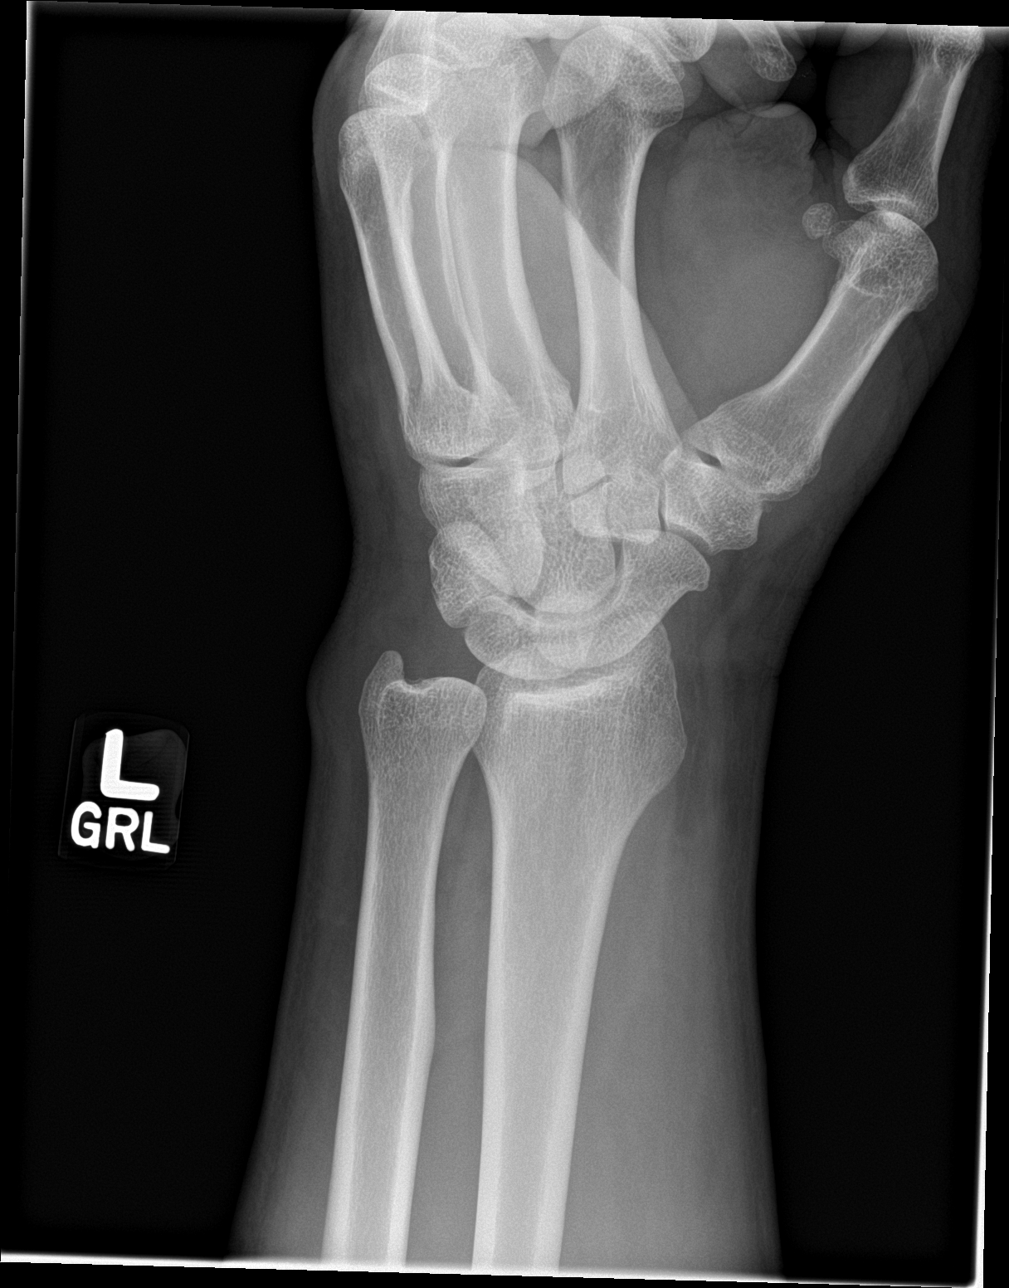

[wrist lat]
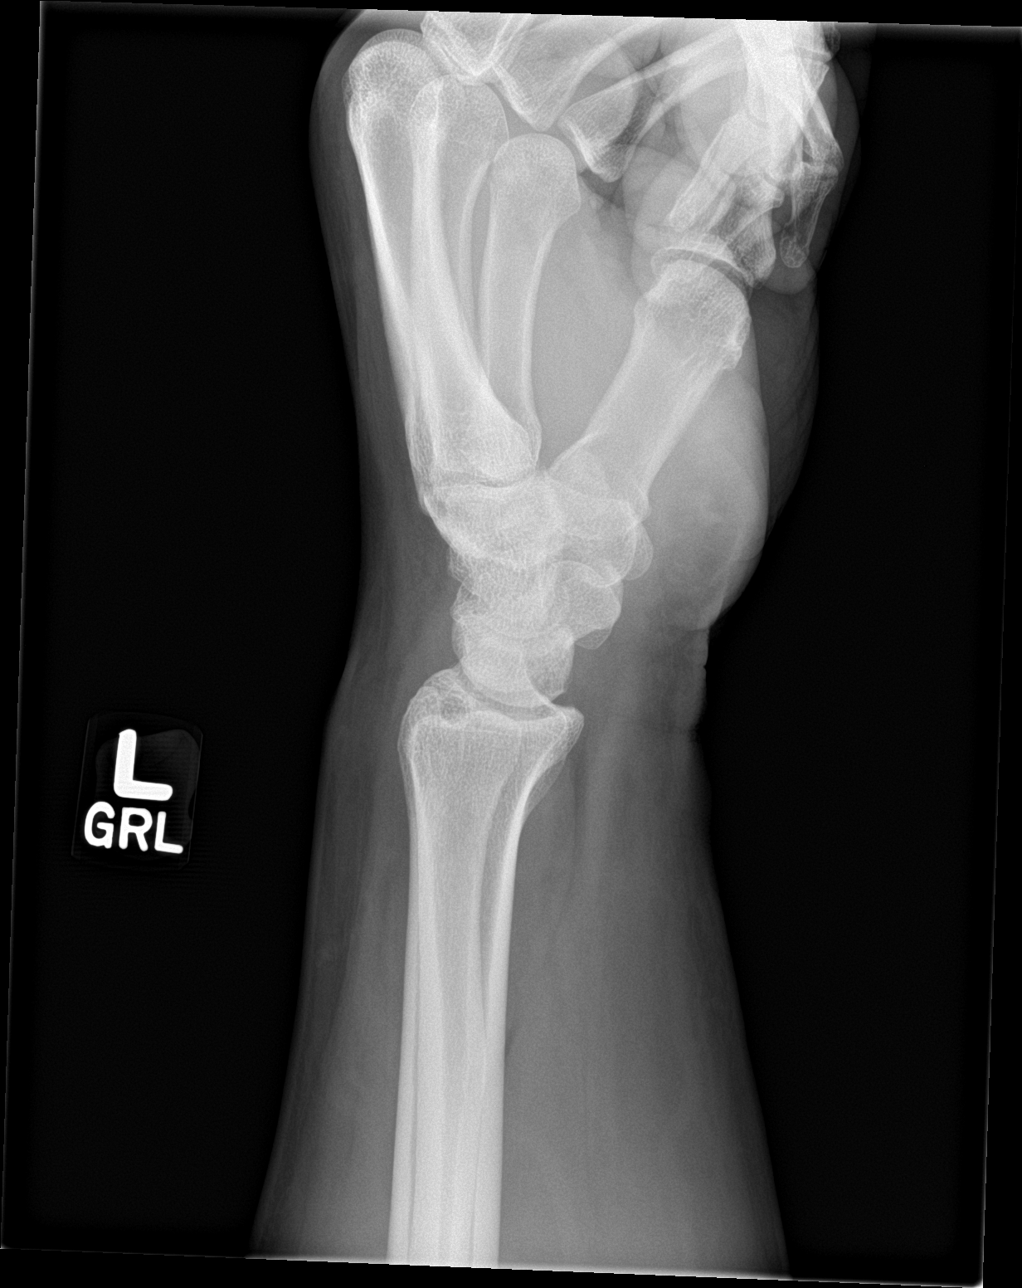

[wrist ap (2 of 2)]
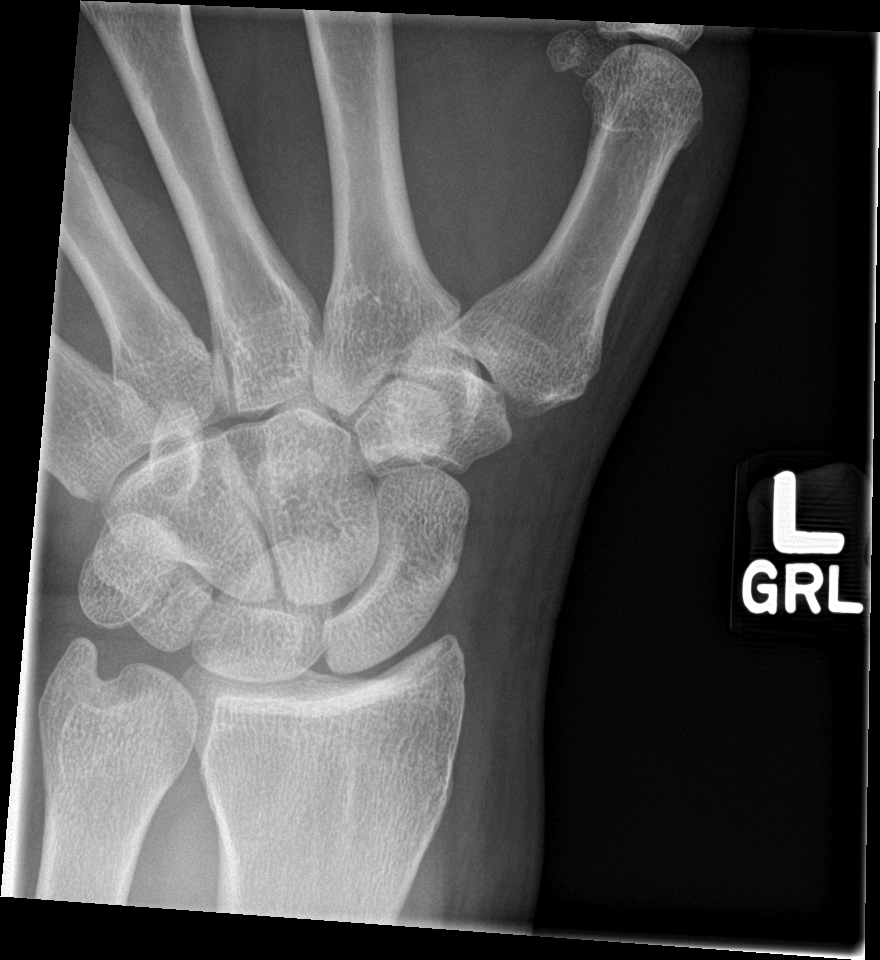

[4 of 4 positions shown; findings below may reference images not displayed]

FINDINGS: No fracture or malalignment. Soft tissue thickening over the dorsum
of the lower wrist. No abnormal soft tissue calcification.
IMPRESSION: No acute osseous abnormality

## 2019-03-24 ENCOUNTER — Ambulatory Visit: Payer: 59

## 2019-04-20 ENCOUNTER — Other Ambulatory Visit: Payer: Self-pay | Admitting: Family Medicine

## 2019-04-20 ENCOUNTER — Other Ambulatory Visit: Payer: Self-pay

## 2019-04-20 DIAGNOSIS — Z20822 Contact with and (suspected) exposure to covid-19: Secondary | ICD-10-CM

## 2019-04-21 LAB — NOVEL CORONAVIRUS, NAA: SARS-CoV-2, NAA: NOT DETECTED

## 2019-05-13 ENCOUNTER — Other Ambulatory Visit: Payer: Self-pay | Admitting: Family Medicine

## 2019-06-26 ENCOUNTER — Telehealth: Payer: Self-pay | Admitting: Family Medicine

## 2019-06-26 ENCOUNTER — Telehealth: Payer: Self-pay

## 2019-06-26 ENCOUNTER — Ambulatory Visit: Payer: 59 | Admitting: Family Medicine

## 2019-06-26 NOTE — Telephone Encounter (Signed)
[employer] said they denied pt's prescription for Albuterol. They said they faxed over the alternatives that need to be used first. (Generic Pro Air/Generic Proventil). Please call them back at [phone]. William Gentry,cma

## 2019-06-26 NOTE — Telephone Encounter (Signed)
[employer] said they denied pt's prescription for Albuterol. They said they faxed over the alternatives that need to be used first. (Generic Pro Air/Generic Proventil). Please call them back at [phone].

## 2019-06-28 MED ORDER — VENTOLIN HFA 108 (90 BASE) MCG/ACT IN AERS: 1.0000 | INHALATION_SPRAY | Freq: Four times a day (QID) | RESPIRATORY_TRACT | 1 refills | Status: DC | PRN

## 2019-06-28 NOTE — Telephone Encounter (Signed)
Brand name ventolin sent to pharmacy.

## 2019-06-28 NOTE — Telephone Encounter (Signed)
See other phone note

## 2019-07-05 ENCOUNTER — Other Ambulatory Visit: Payer: Self-pay | Admitting: Family Medicine

## 2019-07-13 ENCOUNTER — Other Ambulatory Visit: Payer: Self-pay

## 2019-07-13 ENCOUNTER — Ambulatory Visit (INDEPENDENT_AMBULATORY_CARE_PROVIDER_SITE_OTHER): Payer: BC Managed Care – PPO | Admitting: Mental Health

## 2019-07-13 DIAGNOSIS — F4323 Adjustment disorder with mixed anxiety and depressed mood: Secondary | ICD-10-CM

## 2019-07-13 NOTE — Progress Notes (Signed)
      [facility] Counselor/Therapist Progress Note   Patient ID: William Gentry, MRN: [id number]  Date: 07/13/19  Timespent: 57 minutes  Treatment Type: individual therapy  Mental Status Exam:   Appearance:   Casual     Behavior:  Appropriate  Motor:  Normal  Speech/Language:   Clear and Coherent  Affect:  Appropriate  Mood:  normal  Thought process:  Coherent and Relevant  Thought content:    WDL  Perceptual disturbances:    Normal  Orientation:  Full (Time, Place, and Person)  Attention:  Good  Concentration:  good  Memory:  Immediate  Fund of knowledge:   Good  Insight:    Good  Judgment:   Good  Impulse Control:  good    Reported Symptoms: Depressed mood some days, fatigue, low motivation at times,  subsequent anxiety related to marital relationship.  Risk Assessment: Danger to Self:  No Self-injurious Behavior: No Danger to Others: No Duty to Warn:no Physical Aggression / Violence:No  Access to Firearms a concern: No  Gang Involvement:No   Patient / guardian was educated about steps to take if suicide or homicide risk level increases between visits. While future psychiatric events cannot be accurately predicted, the patient does not currently require acute inpatient psychiatric care and does not currently meet [facility] involuntary commitment criteria.  Subjective: Patient and wife arrived on time for today's session.  Discussed progress and recent events since her last session which was several months ago.  They stated they were able to take a vacation together in December, enjoyed the experience but continued to have relational strain in the relationship with 1 another.  Wife the patient continues to identify her needs, how she needs patient to be more consistent with expression of his feelings towards her, and they are having more intimacy.  Patient shared how he often feels that she is complaining and how this affects his mood and motivation to spend more  quality time with her.  He was identified to have very limited physical touch, rarely hug.  They both identify their love languages and assisted them in identifying specific dialects.  These were practiced during session and we encouraged him to continue between sessions.  Interventions: CBT, supportive therapy, communication strategies  Diagnosis:   ICD-10-CM   1. Adjustment disorder with mixed anxiety and depressed mood  F43.23      Plan:   1.  Patient to continue to engage in individual counseling 2-4 times a month or as needed. 2.  Patient to identify and apply CBT, coping skills learned in session to decrease depression and anxiety symptoms. 3.  Patient to improve communication patient to improve self expression and communication with his wife to improve their marital relationship. 4.  Patient to contact this office, go to the local ED or call 911 if a crisis or emergency develops between visits.  [name] Session, [facility]

## 2019-07-20 ENCOUNTER — Other Ambulatory Visit: Payer: Self-pay | Admitting: Family Medicine

## 2019-07-20 DIAGNOSIS — J4530 Mild persistent asthma, uncomplicated: Secondary | ICD-10-CM

## 2019-08-06 ENCOUNTER — Other Ambulatory Visit: Payer: Self-pay

## 2019-08-06 ENCOUNTER — Ambulatory Visit (INDEPENDENT_AMBULATORY_CARE_PROVIDER_SITE_OTHER): Payer: BC Managed Care – PPO | Admitting: Mental Health

## 2019-08-06 DIAGNOSIS — F4323 Adjustment disorder with mixed anxiety and depressed mood: Secondary | ICD-10-CM

## 2019-08-06 NOTE — Progress Notes (Signed)
      [facility] Counselor/Therapist Progress Note   Patient ID: William Gentry, MRN: [id number]  Date: 08/06/19  Timespent: 57 minutes  Treatment Type: individual therapy  Mental Status Exam:   Appearance:   Casual     Behavior:  Appropriate  Motor:  Normal  Speech/Language:   Clear and Coherent  Affect:  Appropriate  Mood:  normal  Thought process:  Coherent and Relevant  Thought content:    WDL  Perceptual disturbances:    Normal  Orientation:  Full (Time, Place, and Person)  Attention:  Good  Concentration:  good  Memory:  Immediate  Fund of knowledge:   Good  Insight:    Good  Judgment:   Good  Impulse Control:  good    Reported Symptoms: Depressed mood some days, fatigue, low motivation at times,  subsequent anxiety related to marital relationship.  Risk Assessment: Danger to Self:  No Self-injurious Behavior: No Danger to Others: No Duty to Warn:no Physical Aggression / Violence:No  Access to Firearms a concern: No  Gang Involvement:No   Patient / guardian was educated about steps to take if suicide or homicide risk level increases between visits. While future psychiatric events cannot be accurately predicted, the patient does not currently require acute inpatient psychiatric care and does not currently meet [facility] involuntary commitment criteria.  Subjective:  Patient shared progress, how he and his wife continue to have marital stress.  Through discussion he shared how most of the stress centers around her inability to move on from his past infidelity which was several years ago.  He has a son with the woman in which he had intimate relations, continues to not get to see him although he has tried to reach out to her to establish this relationship with him.  Communication barriers were identified as examples were given in detail.  Ways to work on his communication were discussed as well with a focus on identifying specific ways he can begin to show her  that he is worthy of her trust.  He shared how she continues to have trust issues, bringing up a recent incident where he was not home until late in the evening.  After discussion, she believed him but continues to experience lingering doubts when incidents like this occur.  The plan is for them to be more intentional and making quality time with 1 another exploring specific ways in which they both can take steps in this area.  Interventions: CBT, supportive therapy, communication strategies  Diagnosis:   ICD-10-CM   1. Adjustment disorder with mixed anxiety and depressed mood  F43.23      Plan:   1.  Patient to continue to engage in individual counseling 2-4 times a month or as needed. 2.  Patient to identify and apply CBT, coping skills learned in session to decrease depression and anxiety symptoms. 3.  Patient to improve communication patient to improve self expression and communication with his wife to improve their marital relationship. 4.  Patient to contact this office, go to the local ED or call 911 if a crisis or emergency develops between visits.  [name] Session, [facility]

## 2019-08-08 ENCOUNTER — Other Ambulatory Visit: Payer: Self-pay | Admitting: Family Medicine

## 2019-08-12 ENCOUNTER — Ambulatory Visit: Payer: 59 | Admitting: Family Medicine

## 2019-08-12 DIAGNOSIS — Z0289 Encounter for other administrative examinations: Secondary | ICD-10-CM

## 2019-08-31 ENCOUNTER — Ambulatory Visit: Payer: BC Managed Care – PPO | Admitting: Mental Health

## 2019-09-04 ENCOUNTER — Other Ambulatory Visit: Payer: Self-pay | Admitting: Family Medicine

## 2019-09-27 ENCOUNTER — Other Ambulatory Visit: Payer: Self-pay | Admitting: Family Medicine

## 2019-10-15 ENCOUNTER — Other Ambulatory Visit: Payer: Self-pay | Admitting: Family Medicine

## 2019-10-15 DIAGNOSIS — J4530 Mild persistent asthma, uncomplicated: Secondary | ICD-10-CM

## 2019-11-26 ENCOUNTER — Other Ambulatory Visit: Payer: Self-pay

## 2019-11-26 ENCOUNTER — Ambulatory Visit (INDEPENDENT_AMBULATORY_CARE_PROVIDER_SITE_OTHER): Payer: BC Managed Care – PPO | Admitting: Mental Health

## 2019-11-26 DIAGNOSIS — F4323 Adjustment disorder with mixed anxiety and depressed mood: Secondary | ICD-10-CM

## 2019-11-26 NOTE — Progress Notes (Signed)
[facility] Counselor/Therapist Progress Note   Patient ID: William Gentry, MRN: [id number]  Date: 11/26/19  Timespent: 53 minutes  Treatment Type: individual therapy  Mental Status Exam:   Appearance:   Casual     Behavior:  Appropriate  Motor:  Normal  Speech/Language:   Clear and Coherent  Affect:  Appropriate  Mood:   Euthymic  Thought process:  Coherent and Relevant  Thought content:    WDL  Perceptual disturbances:    Normal  Orientation:  Full (Time, Place, and Person)  Attention:  Good  Concentration:  good  Memory:   Intact  Fund of knowledge:   Good  Insight:    Good  Judgment:   Good  Impulse Control:  good    Reported Symptoms: Depressed mood some days, fatigue, low motivation at times,  subsequent anxiety related to marital relationship.  Risk Assessment: Danger to Self:  No Self-injurious Behavior: No Danger to Others: No Duty to Warn:no Physical Aggression / Violence:No  Access to Firearms a concern: No  Gang Involvement:No   Patient / guardian was educated about steps to take if suicide or homicide risk level increases between visits. While future psychiatric events cannot be accurately predicted, the patient does not currently require acute inpatient psychiatric care and does not currently meet [facility] involuntary commitment criteria.  Subjective:  Patient presents for session sharing progress.  He stated that he and his wife continue to have marital strain.  He stated they have differences in many areas 1 of which is how they raise the children.  He stated that she has a history of having a temper, yelling, making demands at times of the children when they do not listen.  He says she will blame him for not handling the situations that occur with the children when they do not listen however, he feels that her communication style and approach is warm that causes some if not a large extent of the problems.  He shared how they struggle with  intimacy, that he ultimately has difficulty due to feeling blamed so often in the relationship.  He stated he is expressed to her his needs in terms of her communication style as well as thinking that he will be unfaithful again as he was many years ago.  He stated he has no intention and has not been unfaithful, how they have discussed this at length over the years.  Some effective ways to continue to communicate were explored collaboratively in session.  Provide support, understanding as he processed many feelings related throughout the session.  Provide support and encouragement, identified in session collaboratively efforts he is made and taking the time to remind himself of instances where he is he is effective communication with active listening when having discussions with his wife.  Reviewed the 5 love languages as a resource for their review and their relationship.  Interventions: CBT, supportive therapy, communication strategies  Diagnosis:   ICD-10-CM   1. Adjustment disorder with mixed anxiety and depressed mood  F43.23      Plan:   1.  Patient to continue to engage in individual counseling 2-4 times a month or as needed. 2.  Patient to identify and apply CBT, coping skills learned in session to decrease depression and anxiety symptoms. 3.  Patient to improve communication patient to improve self expression and communication with his wife to improve their marital relationship. 4.  Patient to contact this office, go to the local ED or call  911 if a crisis or emergency develops between visits.  [name] Session, [facility]

## 2019-12-14 ENCOUNTER — Other Ambulatory Visit: Payer: Self-pay | Admitting: Internal Medicine

## 2019-12-24 ENCOUNTER — Ambulatory Visit: Payer: BC Managed Care – PPO | Admitting: Mental Health

## 2020-01-12 ENCOUNTER — Ambulatory Visit: Payer: BC Managed Care – PPO | Admitting: Mental Health

## 2020-02-02 ENCOUNTER — Other Ambulatory Visit: Payer: Self-pay | Admitting: Internal Medicine

## 2020-02-04 ENCOUNTER — Other Ambulatory Visit: Payer: Self-pay | Admitting: Family Medicine

## 2020-02-17 ENCOUNTER — Other Ambulatory Visit: Payer: Self-pay

## 2020-02-17 ENCOUNTER — Ambulatory Visit (INDEPENDENT_AMBULATORY_CARE_PROVIDER_SITE_OTHER): Payer: BC Managed Care – PPO | Admitting: Mental Health

## 2020-02-17 DIAGNOSIS — F4323 Adjustment disorder with mixed anxiety and depressed mood: Secondary | ICD-10-CM | POA: Diagnosis not present

## 2020-02-17 NOTE — Progress Notes (Signed)
[facility] Counselor/Therapist Progress Note   Patient ID: William Gentry, MRN: [id number]  Date: 02/17/20  Timespent: 65 minutes  Treatment Type: individual therapy  Mental Status Exam:   Appearance:   Casual     Behavior:  Appropriate  Motor:  Normal  Speech/Language:   Clear and Coherent  Affect:  Appropriate  Mood:   Euthymic  Thought process:  Coherent and Relevant  Thought content:    WDL  Perceptual disturbances:    Normal  Orientation:  Full (Time, Place, and Person)  Attention:  Good  Concentration:  good  Memory:   Intact  Fund of knowledge:   Good  Insight:    Good  Judgment:   Good  Impulse Control:  good    Reported Symptoms: Depressed mood some days, fatigue, low motivation at times,  subsequent anxiety related to marital relationship.  Risk Assessment: Danger to Self:  No Self-injurious Behavior: No Danger to Others: No Duty to Warn:no Physical Aggression / Violence:No  Access to Firearms a concern: No  Gang Involvement:No   Patient / guardian was educated about steps to take if suicide or homicide risk level increases between visits. While future psychiatric events cannot be accurately predicted, the patient does not currently require acute inpatient psychiatric care and does not currently meet [facility] involuntary commitment criteria.  Subjective:  Patient presents for session, sharing progress since last visit which was about 2 months ago.  He shared how he continues to have relational strain in his marriage, he stated they disagreed on parenting recently giving examples of how his wife will get upset raise her voice with their daughters.  When trying to discuss with her ways to communicate with them differently, he stated she accuses him of not being on her "side" and that he is not "stepping up".  He stated that he maintains rules with the children and enforces them but that his manner of communication is the main difference.  He shared how  these types of situations affects his wanting to be intimate with his wife which continues to be another source of distress in the relationship.  We discussed also the potential benefits of talking with his family doctor as he takes medications to treat some medical conditions that may also play a role in affecting his libido.  He verbalized understanding and plans to follow through in discussing with his doctor.  Through guided discovery, he identified how he wants his wife to make some changes with her behaviors, possibly engage in mental health treatment as it will benefit her and relationships.  Some ways to communicate effectively with his wife his thoughts and feelings were identified as a need and collaboratively we explored ways to utilize active listening when having discussions.  Interventions: CBT, supportive therapy, communication strategies  Diagnosis:   ICD-10-CM   1. Adjustment disorder with mixed anxiety and depressed mood  F43.23      Plan:   1.  Patient to continue to engage in individual counseling 2-4 times a month or as needed. 2.  Patient to identify and apply CBT, coping skills learned in session to decrease depression and anxiety symptoms. 3.  Patient to improve communication patient to improve self expression and communication with his wife to improve their marital relationship. 4.  Patient to contact this office, go to the local ED or call 911 if a crisis or emergency develops between visits.  [name] Session, [facility]

## 2020-02-23 ENCOUNTER — Other Ambulatory Visit: Payer: Self-pay | Admitting: Family Medicine

## 2020-02-23 DIAGNOSIS — J4530 Mild persistent asthma, uncomplicated: Secondary | ICD-10-CM

## 2020-02-24 ENCOUNTER — Other Ambulatory Visit: Payer: Self-pay | Admitting: Family Medicine

## 2020-03-15 ENCOUNTER — Ambulatory Visit: Payer: BC Managed Care – PPO | Admitting: Mental Health

## 2020-04-02 ENCOUNTER — Other Ambulatory Visit: Payer: Self-pay | Admitting: Internal Medicine

## 2020-04-29 ENCOUNTER — Other Ambulatory Visit: Payer: Self-pay | Admitting: Family Medicine

## 2020-06-15 ENCOUNTER — Other Ambulatory Visit: Payer: Self-pay | Admitting: Internal Medicine

## 2020-07-08 ENCOUNTER — Other Ambulatory Visit: Payer: Self-pay | Admitting: Family Medicine

## 2020-08-06 ENCOUNTER — Other Ambulatory Visit: Payer: Self-pay | Admitting: Internal Medicine

## 2020-08-08 NOTE — Telephone Encounter (Signed)
Looks like this is your pt.

## 2020-08-08 NOTE — Telephone Encounter (Signed)
This patient has not been seen since 2020.  Please contact him and see if he has any Ventolin left.  If he does I will wait until his appointment to refill this.  If he does not have enough I will refill it once and then if he does not keep his appointment then it will not be refilled moving forward.

## 2020-08-10 NOTE — Telephone Encounter (Signed)
Patient stated he bought the inhaler off [employer], he is scheduled for 08/24/2020 and will keep that appointment.  Nina,cma

## 2020-08-10 NOTE — Telephone Encounter (Signed)
Noted. Plan for follow-up as scheduled.  

## 2020-08-17 ENCOUNTER — Ambulatory Visit: Payer: Self-pay | Admitting: Mental Health

## 2020-08-21 ENCOUNTER — Other Ambulatory Visit: Payer: Self-pay | Admitting: Internal Medicine

## 2020-08-24 ENCOUNTER — Telehealth: Payer: Self-pay | Admitting: Family Medicine

## 2020-08-24 ENCOUNTER — Other Ambulatory Visit: Payer: Self-pay

## 2020-08-24 ENCOUNTER — Encounter: Payer: Self-pay | Admitting: Family Medicine

## 2020-08-24 ENCOUNTER — Ambulatory Visit (INDEPENDENT_AMBULATORY_CARE_PROVIDER_SITE_OTHER): Payer: BC Managed Care – PPO | Admitting: Family Medicine

## 2020-08-24 VITALS — BP 123/80 | HR 80 | Temp 97.6°F | Ht 72.0 in | Wt 265.4 lb

## 2020-08-24 DIAGNOSIS — R7303 Prediabetes: Secondary | ICD-10-CM

## 2020-08-24 DIAGNOSIS — Z1211 Encounter for screening for malignant neoplasm of colon: Secondary | ICD-10-CM

## 2020-08-24 DIAGNOSIS — R109 Unspecified abdominal pain: Secondary | ICD-10-CM | POA: Insufficient documentation

## 2020-08-24 DIAGNOSIS — Z1159 Encounter for screening for other viral diseases: Secondary | ICD-10-CM

## 2020-08-24 DIAGNOSIS — I10 Essential (primary) hypertension: Secondary | ICD-10-CM | POA: Diagnosis not present

## 2020-08-24 DIAGNOSIS — Z0001 Encounter for general adult medical examination with abnormal findings: Secondary | ICD-10-CM

## 2020-08-24 DIAGNOSIS — N528 Other male erectile dysfunction: Secondary | ICD-10-CM

## 2020-08-24 DIAGNOSIS — E7800 Pure hypercholesterolemia, unspecified: Secondary | ICD-10-CM

## 2020-08-24 DIAGNOSIS — R1032 Left lower quadrant pain: Secondary | ICD-10-CM

## 2020-08-24 DIAGNOSIS — J4530 Mild persistent asthma, uncomplicated: Secondary | ICD-10-CM | POA: Diagnosis not present

## 2020-08-24 DIAGNOSIS — Z125 Encounter for screening for malignant neoplasm of prostate: Secondary | ICD-10-CM

## 2020-08-24 DIAGNOSIS — N529 Male erectile dysfunction, unspecified: Secondary | ICD-10-CM | POA: Insufficient documentation

## 2020-08-24 LAB — HEMOGLOBIN A1C: Hgb A1c MFr Bld: 6.2 % (ref 4.6–6.5)

## 2020-08-24 LAB — LIPID PANEL
Cholesterol: 195 mg/dL (ref 0–200)
HDL: 34.5 mg/dL — ABNORMAL LOW (ref >39.00)
LDL Cholesterol: 142 mg/dL — ABNORMAL HIGH (ref 0–99)
NonHDL: 160.03
Total CHOL/HDL Ratio: 6
Triglycerides: 89 mg/dL (ref 0.0–149.0)
VLDL: 17.8 mg/dL (ref 0.0–40.0)

## 2020-08-24 LAB — COMPREHENSIVE METABOLIC PANEL
ALT: 17 U/L (ref 0–53)
AST: 15 U/L (ref 0–37)
Albumin: 4.4 g/dL (ref 3.5–5.2)
Alkaline Phosphatase: 45 U/L (ref 39–117)
BUN: 11 mg/dL (ref 6–23)
CO2: 29 mEq/L (ref 19–32)
Calcium: 9.9 mg/dL (ref 8.4–10.5)
Chloride: 103 mEq/L (ref 96–112)
Creatinine, Ser: 0.72 mg/dL (ref 0.40–1.50)
GFR: 105.91 mL/min (ref >60.00)
Glucose, Bld: 106 mg/dL — ABNORMAL HIGH (ref 70–99)
Potassium: 4.2 mEq/L (ref 3.5–5.1)
Sodium: 139 mEq/L (ref 135–145)
Total Bilirubin: 0.4 mg/dL (ref 0.2–1.2)
Total Protein: 7.6 g/dL (ref 6.0–8.3)

## 2020-08-24 LAB — PSA: PSA: 0.66 ng/mL (ref 0.10–4.00)

## 2020-08-24 MED ORDER — BUDESONIDE-FORMOTEROL FUMARATE 80-4.5 MCG/ACT IN AERO: INHALATION_SPRAY | RESPIRATORY_TRACT | 1 refills | Status: DC

## 2020-08-24 NOTE — Patient Instructions (Signed)
Nice to see you. Please continue to work on diet and exercise. We will get lab work today. I have referred you to GI for colonoscopy.  Somebody should contact you to get this scheduled. Please try the Symbicort for your asthma.  If it is not affordable please let me know. Please consider getting the COVID-19 booster vaccine.

## 2020-08-24 NOTE — Assessment & Plan Note (Signed)
Patient is using albuterol too frequently.  He now has insurance that may cover Symbicort.  We will send this in for refill.  He will follow up with me in about 3 months for this.

## 2020-08-24 NOTE — Assessment & Plan Note (Signed)
This is likely related to a psychological issue.  I encouraged him to see a counselor for his marriage issues.

## 2020-08-24 NOTE — Assessment & Plan Note (Signed)
Physical exam completed.  Encouraged continued dietary changes.  Discussed continued exercise.  Will refer to GI for colonoscopy.  PSA for prostate cancer screening.  I encouraged him to get his COVID-19 booster.  Discussed tobacco cessation.  We will check with our clinical pharmacist regarding nicotine patch for his dip use.  I encouraged him to quit using marijuana as well.  Lab work as outlined below.

## 2020-08-24 NOTE — Assessment & Plan Note (Signed)
Given the history and the fact that this has resolved I suspect this is related to his diet.  Encouraged continued healthier diet.  He will monitor for recurrence.

## 2020-08-24 NOTE — Progress Notes (Signed)
[name], MD Phone: [phone]  William Gentry is a 50 y.o. male who presents today for CPE.  Diet: Patient describes a normal diet, he has cut down on fast foods and fatty dairy products.  He is also cut down on sodas. Exercise: Going to the gym.  Lifting weights and doing cardio. Colonoscopy: Due Prostate cancer screening: Due Family history-  Prostate cancer: No  Colon cancer: No Sexually active: Not in the past year Vaccines-   Flu: Declines  Tetanus: Up-to-date  COVID19: Received the first 2 vaccines HIV screening: Up-to-date Hep C Screening: Due Tobacco use: Dips, patient is ready to quit, reports he responded well to nicotine lozenges in the past Alcohol use: no Illicit Drug use: marijuana Dentist: yes Ophthalmology: yes  Asthma: Patient notes he uses albuterol once a day.  Sometimes every other day.  He could not afford the Symbicort because he went without insurance for several months.  Sexual difficulty: Patient feels as though this is a mental issue.  He notes he tries to avoid his wife to a certain degree.  He has been doing some counseling for his marriage though his new insurance will not cover this.  He notes he wakes up with erections.  He has no issues with erections when masturbating.  Abdominal pain: Patient notes this occurred about a month ago.  He had several days where he had this sharp left lower quadrant abdominal discomfort.  He had no associated diarrhea or vomiting.  No constipation.  No fevers.  No dysuria.  No change to urinary frequency.  He notes he had been eating a lot of fast food around that time.  He backed off on that and backed off on cheese and dairy and notes the symptoms have resolved.   Active Ambulatory Problems    Diagnosis Date Noted  . Asthma 07/03/2012  . Rhinitis 07/03/2012  . Essential hypertension 03/25/2014  . Pure hypercholesterolemia 03/25/2014  . Prediabetes 03/25/2014  . Urine frequency 12/07/2015  . Encounter  for general adult medical examination with abnormal findings 03/21/2016  . Plantar fasciitis 09/07/2016  . Varicose veins of both lower extremities 12/18/2016  . Chronic fatigue 01/25/2018  . Erectile dysfunction 08/24/2020  . Abdominal pain 08/24/2020   Resolved Ambulatory Problems    Diagnosis Date Noted  . No Resolved Ambulatory Problems   Past Medical History:  Diagnosis Date  . Alcoholism in recovery ([place])   . Allergy   . Hypertension   . Wolff-Parkinson-White (WPW) syndrome     Family History  Problem Relation Age of Onset  . Cancer Mother        brain cancer  . Lupus Mother     Social History   Socioeconomic History  . Marital status: Married    Spouse name: Not on file  . Number of children: Not on file  . Years of education: Not on file  . Highest education level: Not on file  Occupational History  . Not on file  Tobacco Use  . Smoking status: Former Research scientist (life sciences)  . Smokeless tobacco: Never Used  Substance and Sexual Activity  . Alcohol use: No    Comment: former alcoholic, clean for four years (2015)  . Drug use: No  . Sexual activity: Yes  Other Topics Concern  . Not on file  Social History Narrative   Marital status: married      Children: 4 girls; 2 grandchildren      Lives: with wife, 2 youngest (9,7)  Employment: [employer]      Tobacco: quit smoking; smoked x 7 years.      Alcohol: quit drinking 4 years ago; heavy.      Drugs: none      Exercise:  trying   Social Determinants of Health   Financial Resource Strain: Not on file  Food Insecurity: Not on file  Transportation Needs: Not on file  Physical Activity: Not on file  Stress: Not on file  Social Connections: Not on file  Intimate Partner Violence: Not on file    ROS  General:  Negative for nexplained weight loss, fever Skin: Negative for new or changing mole, sore that won't heal HEENT: Negative for trouble hearing, trouble seeing, ringing in ears, mouth sores, hoarseness,  change in voice, dysphagia. CV:  Negative for chest pain, dyspnea, edema, palpitations Resp: Negative for cough, dyspnea, hemoptysis GI: Negative for nausea, vomiting, diarrhea, constipation, abdominal pain, melena, hematochezia. GU: Negative for dysuria, incontinence, urinary hesitance, hematuria, vaginal or penile discharge, polyuria, sexual difficulty, lumps in testicle or breasts MSK: Negative for muscle cramps or aches, joint pain or swelling Neuro: Negative for headaches, weakness, numbness, dizziness, passing out/fainting Psych: Negative for depression, anxiety, memory problems  Objective  Physical Exam Vitals:   08/24/20 0900  BP: 123/80  Pulse: 80  Temp: 97.6 F (36.4 C)  SpO2: 97%    BP Readings from Last 3 Encounters:  08/24/20 123/80  02/20/19 130/90  01/24/18 (!) 138/98   Wt Readings from Last 3 Encounters:  08/24/20 265 lb 6.4 oz (120.4 kg)  02/20/19 280 lb 12.8 oz (127.4 kg)  01/24/18 274 lb 6.4 oz (124.5 kg)    Physical Exam Constitutional:      General: He is not in acute distress.    Appearance: He is not diaphoretic.  HENT:     Head: Normocephalic and atraumatic.  Eyes:     Conjunctiva/sclera: Conjunctivae normal.     Pupils: Pupils are equal, round, and reactive to light.  Cardiovascular:     Rate and Rhythm: Normal rate and regular rhythm.     Heart sounds: Normal heart sounds.  Pulmonary:     Effort: Pulmonary effort is normal.     Breath sounds: Normal breath sounds.  Abdominal:     General: Bowel sounds are normal. There is no distension.     Palpations: Abdomen is soft. There is no mass.     Tenderness: There is no abdominal tenderness. There is no guarding or rebound.  Musculoskeletal:     Right lower leg: No edema.     Left lower leg: No edema.  Lymphadenopathy:     Cervical: No cervical adenopathy.  Skin:    General: Skin is warm and dry.  Neurological:     Mental Status: He is alert.  Psychiatric:        Mood and Affect: Mood  normal.      Assessment/Plan:   Problem List Items Addressed This Visit    Abdominal pain    Given the history and the fact that this has resolved I suspect this is related to his diet.  Encouraged continued healthier diet.  He will monitor for recurrence.      Asthma    Patient is using albuterol too frequently.  He now has insurance that may cover Symbicort.  We will send this in for refill.  He will follow up with me in about 3 months for this.      Relevant Medications   budesonide-formoterol (  SYMBICORT) 80-4.5 MCG/ACT inhaler   Encounter for general adult medical examination with abnormal findings - Primary    Physical exam completed.  Encouraged continued dietary changes.  Discussed continued exercise.  Will refer to GI for colonoscopy.  PSA for prostate cancer screening.  I encouraged him to get his COVID-19 booster.  Discussed tobacco cessation.  We will check with our clinical pharmacist regarding nicotine patch for his dip use.  I encouraged him to quit using marijuana as well.  Lab work as outlined below.      Erectile dysfunction    This is likely related to a psychological issue.  I encouraged him to see a counselor for his marriage issues.      Essential hypertension   Relevant Orders   Comp Met (CMET)   Prediabetes   Relevant Orders   HgB A1c   Pure hypercholesterolemia   Relevant Orders   Comp Met (CMET)   Lipid panel    Other Visit Diagnoses    Colon cancer screening       Relevant Orders   Ambulatory referral to Gastroenterology   Need for hepatitis C screening test       Relevant Orders   Hepatitis C Antibody   Prostate cancer screening       Relevant Orders   PSA      This visit occurred during the SARS-CoV-2 public health emergency.  Safety protocols were in place, including screening questions prior to the visit, additional usage of staff PPE, and extensive cleaning of exam room while observing appropriate contact time as indicated for  disinfecting solutions.    [name], MD [place]

## 2020-08-24 NOTE — Telephone Encounter (Signed)
Please let the patient know I heard back from the clinical pharmacist regarding the nicotine patches.  They gave a recommendation to use a 21 mg nicotine patch and have him use gum or lozenges for as needed use.  If he is willing to start on these I can send them to his pharmacy.

## 2020-08-24 NOTE — Telephone Encounter (Signed)
-----   Message from Lourena Simmonds, RPH-CPP sent at 08/24/2020  9:59 AM EDT ----- So, a can of dip can be ~ 80 mg. Depends on how long he holds it as to how much is absorbed. I'd start w/ 21 mg patch PLUS a script for gum or lozenges for PRN use ----- Message ----- From: Glori Luis, MD Sent: 08/24/2020   9:41 AM EDT To: Lourena Simmonds, RPH-CPP  Hey,   This guy is using 1/2 a can of dip daily. What would the appropriate nicotine patch strength be for this? Would we start with the 14 mg patch for 6 weeks then taper to the 7 mg patch? Thanks.  Minerva Areola

## 2020-08-24 NOTE — Telephone Encounter (Signed)
LVM for the patient to call back and ask for me.  Nariah Morgano,cma

## 2020-08-25 LAB — HEPATITIS C ANTIBODY
Hepatitis C Ab: NONREACTIVE
SIGNAL TO CUT-OFF: 0.01 (ref <1.00)

## 2020-08-25 NOTE — Telephone Encounter (Signed)
Patient was called and he is willing to use the patch and gum or lozenges and wants this sent to the pharamcy.  William Gentry,cma

## 2020-08-26 ENCOUNTER — Other Ambulatory Visit: Payer: Self-pay | Admitting: Family Medicine

## 2020-08-26 DIAGNOSIS — E785 Hyperlipidemia, unspecified: Secondary | ICD-10-CM

## 2020-08-26 MED ORDER — NICOTINE 21 MG/24HR TD PT24: 21.0000 mg | MEDICATED_PATCH | Freq: Every day | TRANSDERMAL | 0 refills | Status: DC

## 2020-08-26 MED ORDER — NICOTINE POLACRILEX 2 MG MT LOZG: 2.0000 mg | LOZENGE | OROMUCOSAL | 0 refills | Status: DC | PRN

## 2020-08-26 NOTE — Addendum Note (Signed)
Addended by: Glori Luis on: 08/26/2020 10:49 AM   Modules accepted: Orders

## 2020-08-26 NOTE — Telephone Encounter (Addendum)
First step patches sent to pharmacy.  Lozenges also sent to pharmacy.  He needs to contact us in about 5 weeks to get the second step patch filled.

## 2020-09-02 ENCOUNTER — Other Ambulatory Visit: Payer: Self-pay

## 2020-09-02 DIAGNOSIS — Z1211 Encounter for screening for malignant neoplasm of colon: Secondary | ICD-10-CM

## 2020-09-02 MED ORDER — PEG 3350-KCL-NA BICARB-NACL 420 G PO SOLR: ORAL | 0 refills | Status: DC

## 2020-09-19 ENCOUNTER — Telehealth: Payer: Self-pay | Admitting: Gastroenterology

## 2020-09-19 NOTE — Telephone Encounter (Signed)
Gastroenterology Pre-Procedure Review  Request Date: 09/30/20 Requesting Physician: Dr. Tobi Bastos  PATIENT REVIEW QUESTIONS: The patient responded to the following health history questions as indicated:    1. Are you having any GI issues? no 2. Do you have a personal history of Polyps? no 3. Do you have a family history of Colon Cancer or Polyps? no 4. Diabetes Mellitus? no 5. Joint replacements in the past 12 months?no 6. Major health problems in the past 3 months?no 7. Any artificial heart valves, MVP, or defibrillator?no     Pulmonary: asthma  MEDICATIONS & ALLERGIES:    Patient reports the following regarding taking any anticoagulation/antiplatelet therapy:   Plavix, Coumadin, Eliquis, Xarelto, Lovenox, Pradaxa, Brilinta, or Effient? no Aspirin? no  Patient confirms/reports the following medications:  Current Outpatient Medications  Medication Sig Dispense Refill  . amLODipine (NORVASC) 5 MG tablet TAKE 1 TABLET BY MOUTH EVERY DAY 30 tablet 11  . budesonide-formoterol (SYMBICORT) 80-4.5 MCG/ACT inhaler TAKE 2 PUFFS BY MOUTH TWICE A DAY 30.6 each 1  . diclofenac (VOLTAREN) 75 MG EC tablet Take 1 tablet (75 mg total) by mouth 2 (two) times daily as needed. 30 tablet 0  . diphenhydrAMINE (BENADRYL) 25 mg capsule Take 1 capsule (25 mg total) by mouth every 4 (four) hours as needed. 30 capsule 2  . nicotine (NICODERM CQ - DOSED IN MG/24 HOURS) 21 mg/24hr patch Place 1 patch (21 mg total) onto the skin daily. Call for next step patch. 42 patch 0  . nicotine polacrilex (COMMIT) 2 MG lozenge Take 1 lozenge (2 mg total) by mouth as needed for smoking cessation. 100 tablet 0  . polyethylene glycol-electrolytes (GAVILYTE-N WITH FLAVOR PACK) 420 g solution Drink one 8 oz glass every 20 mins until entire container is finished starting at 5:00pm on 09/29/20 4000 mL 0  . rosuvastatin (CRESTOR) 20 MG tablet TAKE 1 TABLET BY MOUTH EVERY DAY 30 tablet 2  . VENTOLIN HFA 108 (90 Base) MCG/ACT inhaler INHALE  1-2 PUFFS BY MOUTH EVERY 6 HOURS AS NEEDED FOR WHEEZE OR SHORTNESS OF BREATH 18 each 1   No current facility-administered medications for this visit.    Patient confirms/reports the following allergies:  Allergies  Allergen Reactions  . Shellfish Allergy Swelling    No orders of the defined types were placed in this encounter.   AUTHORIZATION INFORMATION Primary Insurance: 1D#: Group #:  Secondary Insurance: 1D#: Group #:  SCHEDULE INFORMATION: Date:  Time: Location:

## 2020-09-20 ENCOUNTER — Other Ambulatory Visit: Payer: Self-pay | Admitting: Family Medicine

## 2020-09-29 ENCOUNTER — Encounter: Payer: Self-pay | Admitting: Gastroenterology

## 2020-09-30 ENCOUNTER — Encounter
Admission: RE | Disposition: A | Discharge status: Home / Self Care | Payer: Self-pay | Source: Home / Self Care | Attending: Gastroenterology

## 2020-09-30 ENCOUNTER — Other Ambulatory Visit: Payer: Self-pay

## 2020-09-30 ENCOUNTER — Ambulatory Visit
Admission: RE | Admit: 2020-09-30 | Discharge: 2020-09-30 | Disposition: A | Discharge status: Home / Self Care | Payer: BC Managed Care – PPO | Attending: Gastroenterology | Admitting: Gastroenterology

## 2020-09-30 ENCOUNTER — Encounter: Payer: Self-pay | Admitting: Gastroenterology

## 2020-09-30 ENCOUNTER — Ambulatory Visit: Payer: BC Managed Care – PPO | Admitting: Certified Registered"

## 2020-09-30 DIAGNOSIS — Z91013 Allergy to seafood: Secondary | ICD-10-CM | POA: Insufficient documentation

## 2020-09-30 DIAGNOSIS — K621 Rectal polyp: Secondary | ICD-10-CM | POA: Diagnosis not present

## 2020-09-30 DIAGNOSIS — K635 Polyp of colon: Secondary | ICD-10-CM | POA: Insufficient documentation

## 2020-09-30 DIAGNOSIS — Z87891 Personal history of nicotine dependence: Secondary | ICD-10-CM | POA: Insufficient documentation

## 2020-09-30 DIAGNOSIS — Z1211 Encounter for screening for malignant neoplasm of colon: Secondary | ICD-10-CM | POA: Insufficient documentation

## 2020-09-30 DIAGNOSIS — Z79899 Other long term (current) drug therapy: Secondary | ICD-10-CM | POA: Diagnosis not present

## 2020-09-30 DIAGNOSIS — Z7951 Long term (current) use of inhaled steroids: Secondary | ICD-10-CM | POA: Insufficient documentation

## 2020-09-30 HISTORY — PX: COLONOSCOPY WITH PROPOFOL: SHX5780

## 2020-09-30 SURGERY — COLONOSCOPY WITH PROPOFOL
Anesthesia: General

## 2020-09-30 MED ORDER — DEXMEDETOMIDINE (PRECEDEX) IN NS 20 MCG/5ML (4 MCG/ML) IV SYRINGE
PREFILLED_SYRINGE | INTRAVENOUS | Status: DC | PRN
  Administered 2020-09-30: 8 ug via INTRAVENOUS
  Administered 2020-09-30 (×2): 4 ug via INTRAVENOUS

## 2020-09-30 MED ORDER — LIDOCAINE HCL (CARDIAC) PF 100 MG/5ML IV SOSY
PREFILLED_SYRINGE | INTRAVENOUS | Status: DC | PRN
  Administered 2020-09-30: 50 mg via INTRAVENOUS

## 2020-09-30 MED ORDER — SODIUM CHLORIDE 0.9 % IV SOLN: INTRAVENOUS | Status: DC

## 2020-09-30 MED ORDER — PROPOFOL 500 MG/50ML IV EMUL
INTRAVENOUS | Status: DC | PRN
  Administered 2020-09-30: 150 ug/kg/min via INTRAVENOUS

## 2020-09-30 MED ORDER — PROPOFOL 10 MG/ML IV BOLUS
INTRAVENOUS | Status: DC | PRN
  Administered 2020-09-30: 30 mg via INTRAVENOUS
  Administered 2020-09-30 (×2): 20 mg via INTRAVENOUS
  Administered 2020-09-30: 80 mg via INTRAVENOUS

## 2020-09-30 NOTE — Op Note (Signed)
[facility] Gastroenterology Patient Name: William Gentry Procedure Date: 09/30/2020 9:08 AM MRN: [id number] Account #: [id number] Date of Birth: 01-18-1971 Admit Type: Outpatient Age: 50 Room: [facility] ENDO ROOM 4 Gender: Male Note Status: Finalized Procedure:             Colonoscopy Indications:           Screening for colorectal malignant neoplasm Providers:             [name] MD, MD Referring MD:          Yehuda Mao. Birdie Sons (Referring MD) Medicines:             Monitored Anesthesia Care Complications:         No immediate complications. Procedure:             Pre-Anesthesia Assessment:                        - Prior to the procedure, a History and Physical was                         performed, and patient medications, allergies and                         sensitivities were reviewed. The patient's tolerance                         of previous anesthesia was reviewed.                        - The risks and benefits of the procedure and the                         sedation options and risks were discussed with the                         patient. All questions were answered and informed                         consent was obtained.                        - ASA Grade Assessment: II - A patient with mild                         systemic disease.                        After obtaining informed consent, the colonoscope was                         passed under direct vision. Throughout the procedure,                         the patient's blood pressure, pulse, and oxygen                         saturations were monitored continuously. The                         Colonoscope was introduced through the anus and  advanced to the the cecum, identified by the                         appendiceal orifice. The colonoscopy was performed                         with ease. The patient tolerated the procedure well.                         The quality of  the bowel preparation was excellent. Findings:      The perianal and digital rectal examinations were normal.      A 4 mm polyp was found in the rectum. The polyp was sessile. The polyp       was removed with a cold biopsy forceps. Resection and retrieval were       complete.      The exam was otherwise without abnormality on direct and retroflexion       views. Impression:            - One 4 mm polyp in the rectum, removed with a cold                         biopsy forceps. Resected and retrieved.                        - The examination was otherwise normal on direct and                         retroflexion views. Recommendation:        - Discharge patient to home (ambulatory).                        - Resume previous diet.                        - Continue present medications.                        - Await pathology results.                        - Repeat colonoscopy for surveillance based on                         pathology results. Procedure Code(s):     --- Professional ---                        [phone], Colonoscopy, flexible; with biopsy, single or                         multiple Diagnosis Code(s):     --- Professional ---                        Z12.11, Encounter for screening for malignant neoplasm                         of colon                        K62.1, Rectal polyp  CPT copyright 2019 American Medical Association. All rights reserved. The codes documented in this report are preliminary and upon coder review may  be revised to meet current compliance requirements. [name], MD [name] MD, MD 09/30/2020 9:50:25 AM This report has been signed electronically. Number of Addenda: 0 Note Initiated On: 09/30/2020 9:08 AM Scope Withdrawal Time: 0 hours 10 minutes 24 seconds  Total Procedure Duration: 0 hours 11 minutes 29 seconds  Estimated Blood Loss:  Estimated blood loss: none.      [facility]

## 2020-09-30 NOTE — Transfer of Care (Signed)
Immediate Anesthesia Transfer of Care Note  Patient: William Gentry  Procedure(s) Performed: COLONOSCOPY WITH PROPOFOL (N/A )  Patient Location: PACU and Endoscopy Unit  Anesthesia Type:General  Level of Consciousness: awake, drowsy and patient cooperative  Airway & Oxygen Therapy: Patient Spontanous Breathing  Post-op Assessment: Report given to RN and Post -op Vital signs reviewed and stable  Post vital signs: Reviewed and stable  Last Vitals:  Vitals Value Taken Time  BP 109/71 09/30/20 0952  Temp    Pulse 71 09/30/20 0952  Resp 22 09/30/20 0952  SpO2 98 % 09/30/20 0952  Vitals shown include unvalidated device data.  Last Pain:  Vitals:   09/30/20 0952  TempSrc:   PainSc: 0-No pain         Complications: No complications documented.

## 2020-09-30 NOTE — Anesthesia Postprocedure Evaluation (Signed)
Anesthesia Post Note  Patient: William Gentry  Procedure(s) Performed: COLONOSCOPY WITH PROPOFOL (N/A )  Patient location during evaluation: PACU Anesthesia Type: General Level of consciousness: awake and alert Pain management: pain level controlled Vital Signs Assessment: post-procedure vital signs reviewed and stable Respiratory status: spontaneous breathing, nonlabored ventilation, respiratory function stable and patient connected to nasal cannula oxygen Cardiovascular status: blood pressure returned to baseline and stable Postop Assessment: no apparent nausea or vomiting Anesthetic complications: no   No complications documented.   Last Vitals:  Vitals:   09/30/20 1002 09/30/20 1012  BP: 122/77 116/85  Pulse: 65 62  Resp: 20 15  Temp:    SpO2: 97% 97%    Last Pain:  Vitals:   09/30/20 1012  TempSrc:   PainSc: 0-No pain                 Yevette Edwards

## 2020-09-30 NOTE — H&P (Signed)
[name], MD [address], [address], [place], [place], [address] [address], [place], [place], [address] Phone: [phone]  Fax: [phone]  Primary Care Physician:  Glori Luis, MD   Pre-Procedure History & Physical: HPI:  William Gentry is a 50 y.o. male is here for an colonoscopy.   Past Medical History:  Diagnosis Date  . Alcoholism in recovery [facility])    recovery since 2011  . Allergy   . Asthma   . Hypertension   . Wolff-Parkinson-White (WPW) syndrome     Past Surgical History:  Procedure Laterality Date  . CARDIAC SURGERY      Prior to Admission medications   Medication Sig Start Date End Date Taking? Authorizing Provider  amLODipine (NORVASC) 5 MG tablet TAKE 1 TABLET BY MOUTH EVERY DAY 02/24/20   Glori Luis, MD  budesonide-formoterol [facility]) 80-4.5 MCG/ACT inhaler TAKE 2 PUFFS BY MOUTH TWICE A DAY 08/24/20   Glori Luis, MD  diclofenac (VOLTAREN) 75 MG EC tablet Take 1 tablet (75 mg total) by mouth 2 (two) times daily as needed. 09/07/16   Tommie Sams, DO  diphenhydrAMINE (BENADRYL) 25 mg capsule Take 1 capsule (25 mg total) by mouth every 4 (four) hours as needed. 09/15/16 09/15/17  Emily Filbert, MD  nicotine (NICODERM CQ - DOSED IN MG/24 HOURS) 21 mg/24hr patch Place 1 patch (21 mg total) onto the skin daily. Call for next step patch. 08/26/20   Glori Luis, MD  nicotine polacrilex (COMMIT) 2 MG lozenge TAKE 1 LOZENGE (2 MG TOTAL) BY MOUTH AS NEEDED FOR SMOKING CESSATION. 09/20/20   Glori Luis, MD  polyethylene glycol-electrolytes (GAVILYTE-N WITH FLAVOR PACK) 420 g solution Drink one 8 oz glass every 20 mins until entire container is finished starting at 5:00pm on 09/29/20 09/02/20   Midge Minium, MD  rosuvastatin (CRESTOR) 20 MG tablet TAKE 1 TABLET BY MOUTH EVERY DAY 07/08/20   Glori Luis, MD  VENTOLIN HFA 108 (90 Base) MCG/ACT inhaler INHALE 1-2 PUFFS BY MOUTH EVERY 6 HOURS AS NEEDED FOR WHEEZE  OR SHORTNESS OF BREATH 08/22/20   [place], MD    Allergies as of 09/02/2020 - Review Complete 08/24/2020  Allergen Reaction Noted  . Shellfish allergy Swelling 01/24/2018    Family History  Problem Relation Age of Onset  . Cancer Mother        brain cancer  . Lupus Mother     Social History   Socioeconomic History  . Marital status: Married    Spouse name: Not on file  . Number of children: Not on file  . Years of education: Not on file  . Highest education level: Not on file  Occupational History  . Not on file  Tobacco Use  . Smoking status: Former Games developer  . Smokeless tobacco: Never Used  Substance and Sexual Activity  . Alcohol use: No    Comment: former alcoholic, clean for four years (2015)  . Drug use: No  . Sexual activity: Yes  Other Topics Concern  . Not on file  Social History Narrative   Marital status: married      Children: 4 girls; 2 grandchildren      Lives: with wife, 2 youngest (9,7)      Employment: [employer]      Tobacco: quit smoking; smoked x 7 years.      Alcohol: quit drinking 4 years ago; heavy.      Drugs: none  Exercise:  trying   Social Determinants of Health   Financial Resource Strain: Not on file  Food Insecurity: Not on file  Transportation Needs: Not on file  Physical Activity: Not on file  Stress: Not on file  Social Connections: Not on file  Intimate Partner Violence: Not on file    Review of Systems: See HPI, otherwise negative ROS  Physical Exam: There were no vitals taken for this visit. General:   Alert,  pleasant and cooperative in NAD Head:  Normocephalic and atraumatic. Neck:  Supple; no masses or thyromegaly. Lungs:  Clear throughout to auscultation, normal respiratory effort.    Heart:  +S1, +S2, Regular rate and rhythm, No edema. Abdomen:  Soft, nontender and nondistended. Normal bowel sounds, without guarding, and without rebound.   Neurologic:  Alert and  oriented x4;  grossly normal  neurologically.  Impression/Plan: William Gentry is here for an colonoscopy to be performed for Screening colonoscopy average risk   Risks, benefits, limitations, and alternatives regarding  colonoscopy have been reviewed with the patient.  Questions have been answered.  All parties agreeable.   [name], MD  09/30/2020, 9:04 AM

## 2020-09-30 NOTE — Anesthesia Preprocedure Evaluation (Signed)
Anesthesia Evaluation  Patient identified by MRN, date of birth, ID band Patient awake    Reviewed: Allergy & Precautions, H&P , NPO status , Patient's Chart, lab work & pertinent test results, reviewed documented beta blocker date and time   Airway Mallampati: II   Neck ROM: full    Dental  (+) Poor Dentition   Pulmonary asthma , former smoker,    Pulmonary exam normal        Cardiovascular Exercise Tolerance: Good hypertension, On Medications negative cardio ROS Normal cardiovascular exam Rhythm:regular Rate:Normal     Neuro/Psych PSYCHIATRIC DISORDERS negative neurological ROS     GI/Hepatic negative GI ROS, Neg liver ROS,   Endo/Other  negative endocrine ROS  Renal/GU negative Renal ROS  negative genitourinary   Musculoskeletal   Abdominal   Peds  Hematology negative hematology ROS (+)   Anesthesia Other Findings Past Medical History: No date: Alcoholism in recovery [facility])     Comment:  recovery since 2011 No date: Allergy No date: Asthma No date: Hypertension No date: Wolff-Parkinson-White (WPW) syndrome Past Surgical History: No date: CARDIAC SURGERY   Reproductive/Obstetrics negative OB ROS                             Anesthesia Physical Anesthesia Plan  ASA: II  Anesthesia Plan: General   Post-op Pain Management:    Induction:   PONV Risk Score and Plan:   Airway Management Planned:   Additional Equipment:   Intra-op Plan:   Post-operative Plan:   Informed Consent: I have reviewed the patients History and Physical, chart, labs and discussed the procedure including the risks, benefits and alternatives for the proposed anesthesia with the patient or authorized representative who has indicated his/her understanding and acceptance.     Dental Advisory Given  Plan Discussed with: CRNA  Anesthesia Plan Comments:         Anesthesia Quick Evaluation

## 2020-10-03 ENCOUNTER — Encounter: Payer: Self-pay | Admitting: Gastroenterology

## 2020-10-03 LAB — SURGICAL PATHOLOGY

## 2020-10-04 ENCOUNTER — Encounter: Payer: Self-pay | Admitting: Gastroenterology

## 2020-10-19 ENCOUNTER — Other Ambulatory Visit: Payer: BC Managed Care – PPO

## 2020-10-20 ENCOUNTER — Other Ambulatory Visit (INDEPENDENT_AMBULATORY_CARE_PROVIDER_SITE_OTHER): Payer: BC Managed Care – PPO

## 2020-10-20 ENCOUNTER — Other Ambulatory Visit: Payer: Self-pay

## 2020-10-20 DIAGNOSIS — E785 Hyperlipidemia, unspecified: Secondary | ICD-10-CM

## 2020-10-20 LAB — LIPID PANEL
Cholesterol: 174 mg/dL (ref 0–200)
HDL: 35 mg/dL — ABNORMAL LOW (ref >39.00)
LDL Cholesterol: 121 mg/dL — ABNORMAL HIGH (ref 0–99)
NonHDL: 138.84
Total CHOL/HDL Ratio: 5
Triglycerides: 88 mg/dL (ref 0.0–149.0)
VLDL: 17.6 mg/dL (ref 0.0–40.0)

## 2020-10-20 LAB — HEPATIC FUNCTION PANEL
ALT: 13 U/L (ref 0–53)
AST: 16 U/L (ref 0–37)
Albumin: 4.3 g/dL (ref 3.5–5.2)
Alkaline Phosphatase: 44 U/L (ref 39–117)
Bilirubin, Direct: 0.1 mg/dL (ref 0.0–0.3)
Total Bilirubin: 0.5 mg/dL (ref 0.2–1.2)
Total Protein: 7.5 g/dL (ref 6.0–8.3)

## 2020-10-24 ENCOUNTER — Other Ambulatory Visit: Payer: Self-pay | Admitting: Internal Medicine

## 2020-10-24 ENCOUNTER — Other Ambulatory Visit: Payer: Self-pay | Admitting: Family Medicine

## 2020-10-27 ENCOUNTER — Telehealth: Payer: Self-pay

## 2020-10-27 DIAGNOSIS — E785 Hyperlipidemia, unspecified: Secondary | ICD-10-CM

## 2020-10-27 MED ORDER — ROSUVASTATIN CALCIUM 40 MG PO TABS: 40.0000 mg | ORAL_TABLET | Freq: Every day | ORAL | 0 refills | Status: DC

## 2020-10-27 NOTE — Telephone Encounter (Signed)
-----   Message from Glori Luis, MD sent at 10/25/2020  9:16 AM EDT ----- Please let the patient know that his LDL has improved slightly with him taking his Crestor no has not improved to his goal level of less than 100.  I would advise increasing the Crestor to 40 mg once daily checking a hepatic function panel and direct LDL in 6 weeks.

## 2020-11-25 ENCOUNTER — Ambulatory Visit: Payer: BC Managed Care – PPO | Admitting: Family Medicine

## 2020-11-25 ENCOUNTER — Encounter: Payer: Self-pay | Admitting: Family Medicine

## 2020-11-25 ENCOUNTER — Other Ambulatory Visit: Payer: Self-pay

## 2020-11-25 DIAGNOSIS — E7800 Pure hypercholesterolemia, unspecified: Secondary | ICD-10-CM | POA: Diagnosis not present

## 2020-11-25 DIAGNOSIS — J4530 Mild persistent asthma, uncomplicated: Secondary | ICD-10-CM

## 2020-11-25 DIAGNOSIS — I10 Essential (primary) hypertension: Secondary | ICD-10-CM | POA: Diagnosis not present

## 2020-11-25 MED ORDER — DULERA 100-5 MCG/ACT IN AERO: 2.0000 | INHALATION_SPRAY | Freq: Two times a day (BID) | RESPIRATORY_TRACT | 1 refills | Status: DC

## 2020-11-25 NOTE — Progress Notes (Signed)
Marikay Alar, MD Phone: [phone]  William Gentry is a 50 y.o. male who presents today for f/u.  HYPERTENSION Disease Monitoring Home BP Monitoring 118-126/83 Chest pain- no    Dyspnea- from asthma Medications Compliance-  taking amlodipine 5 mg daily.   Edema- no  HYPERLIPIDEMIA Symptoms Chest pain on exertion:  no   Medications: Compliance- taking crestor Right upper quadrant pain- no  Muscle aches- no  Asthma: Patient never got the Symbicort.  He notes the pharmacy never contacted him.  He does use the albuterol numerous times during the week.  He notes some days he does not need it though other days he needs it 3 or 4 times.  Notes no wheezing.  He notes some dyspnea with activity triggers him to use the albuterol.  The albuterol is beneficial.  No nighttime symptoms.  Social History   Tobacco Use  Smoking Status Former   Pack years: 0.00   Types: Cigarettes   Quit date: 2010   Years since quitting: 12.5  Smokeless Tobacco Current    Current Outpatient Medications on File Prior to Visit  Medication Sig Dispense Refill   amLODipine (NORVASC) 5 MG tablet TAKE 1 TABLET BY MOUTH EVERY DAY 30 tablet 11   diclofenac (VOLTAREN) 75 MG EC tablet Take 1 tablet (75 mg total) by mouth 2 (two) times daily as needed. 30 tablet 0   nicotine (NICODERM CQ - DOSED IN MG/24 HOURS) 21 mg/24hr patch PLACE 1 PATCH (21 MG TOTAL) ONTO THE SKIN DAILY. CALL FOR NEXT STEP PATCH. 42 patch 0   nicotine polacrilex (COMMIT) 2 MG lozenge TAKE 1 LOZENGE (2 MG TOTAL) BY MOUTH AS NEEDED FOR SMOKING CESSATION. 135 lozenge 0   rosuvastatin (CRESTOR) 40 MG tablet Take 1 tablet (40 mg total) by mouth daily. 90 tablet 0   VENTOLIN HFA 108 (90 Base) MCG/ACT inhaler INHALE 1-2 PUFFS BY MOUTH EVERY 6 HOURS AS NEEDED FOR WHEEZE OR SHORTNESS OF BREATH 18 each 1   diphenhydrAMINE (BENADRYL) 25 mg capsule Take 1 capsule (25 mg total) by mouth every 4 (four) hours as needed. 30 capsule 2   polyethylene  glycol-electrolytes (GAVILYTE-N WITH FLAVOR PACK) 420 g solution Drink one 8 oz glass every 20 mins until entire container is finished starting at 5:00pm on 09/29/20 (Patient not taking: No sig reported) 4000 mL 0   No current facility-administered medications on file prior to visit.     ROS see history of present illness  Objective  Physical Exam Vitals:   11/25/20 1550  BP: 118/72  Pulse: 84  Resp: 15  Temp: 98.3 F (36.8 C)  SpO2: 98%    BP Readings from Last 3 Encounters:  11/25/20 118/72  09/30/20 116/85  08/24/20 123/80   Wt Readings from Last 3 Encounters:  11/25/20 266 lb 6 oz (120.8 kg)  09/30/20 261 lb (118.4 kg)  08/24/20 265 lb 6.4 oz (120.4 kg)    Physical Exam Constitutional:      General: He is not in acute distress.    Appearance: He is not diaphoretic.  Cardiovascular:     Rate and Rhythm: Normal rate and regular rhythm.     Heart sounds: Normal heart sounds.  Pulmonary:     Effort: Pulmonary effort is normal.     Breath sounds: Wheezing (Faint expiratory wheeze) present.  Skin:    General: Skin is warm and dry.  Neurological:     Mental Status: He is alert.     Assessment/Plan: Please see individual problem  list.  Problem List Items Addressed This Visit     Asthma    We will see if his insurance will cover [facility] as it appears this may be one of the more preferred options over Symbicort.  This was sent to his pharmacy.  He was advised to contact us if it is excessively expensive.  He can continue as needed use of albuterol.       Relevant Medications   mometasone-formoterol (DULERA) 100-5 MCG/ACT AERO   Essential hypertension    Above goal.  We will increase his amlodipine to 10 mg once daily.  He will return in 6 weeks for recheck.       Pure hypercholesterolemia    Recheck lipid panel and hepatic function panel.  Continue Crestor 40 mg once daily.       Relevant Orders   Lipid panel   Hepatic function panel   Return in about  6 weeks (around 01/06/2021) for asthma, htn.  This visit occurred during the SARS-CoV-2 public health emergency.  Safety protocols were in place, including screening questions prior to the visit, additional usage of staff PPE, and extensive cleaning of exam room while observing appropriate contact time as indicated for disinfecting solutions.    Marikay Alar, MD [facility] Primary Care [facility]

## 2020-11-25 NOTE — Assessment & Plan Note (Signed)
Above goal.  We will increase his amlodipine to 10 mg once daily.  He will return in 6 weeks for recheck.

## 2020-11-25 NOTE — Assessment & Plan Note (Signed)
We will see if his insurance will cover [facility] as it appears this may be one of the more preferred options over Symbicort.  This was sent to his pharmacy.  He was advised to contact us if it is excessively expensive.  He can continue as needed use of albuterol.

## 2020-11-25 NOTE — Patient Instructions (Addendum)
Nice to see you. Please let us know if your insurance does not cover Dulera or if it is excessively expensive. We will increase yor amlodipine to 10 mg once daily. Will get lab work today.

## 2020-11-25 NOTE — Assessment & Plan Note (Signed)
Recheck lipid panel and hepatic function panel.  Continue Crestor 40 mg once daily.

## 2020-11-26 LAB — LIPID PANEL
Cholesterol: 146 mg/dL (ref <200)
HDL: 31 mg/dL — ABNORMAL LOW (ref >=40)
LDL Cholesterol (Calc): 94 mg/dL (calc)
Non-HDL Cholesterol (Calc): 115 mg/dL (calc) (ref <130)
Total CHOL/HDL Ratio: 4.7 (calc) (ref <5.0)
Triglycerides: 109 mg/dL (ref <150)

## 2020-11-26 LAB — HEPATIC FUNCTION PANEL
AG Ratio: 1.5 (calc) (ref 1.0–2.5)
ALT: 14 U/L (ref 9–46)
AST: 15 U/L (ref 10–35)
Albumin: 4.3 g/dL (ref 3.6–5.1)
Alkaline phosphatase (APISO): 45 U/L (ref 35–144)
Bilirubin, Direct: 0.1 mg/dL (ref 0.0–0.2)
Globulin: 2.8 g/dL (calc) (ref 1.9–3.7)
Indirect Bilirubin: 0.4 mg/dL (calc) (ref 0.2–1.2)
Total Bilirubin: 0.5 mg/dL (ref 0.2–1.2)
Total Protein: 7.1 g/dL (ref 6.1–8.1)

## 2020-11-30 ENCOUNTER — Other Ambulatory Visit: Payer: Self-pay | Admitting: Family Medicine

## 2020-12-18 ENCOUNTER — Other Ambulatory Visit: Payer: Self-pay | Admitting: Family Medicine

## 2020-12-23 DIAGNOSIS — Z20822 Contact with and (suspected) exposure to covid-19: Secondary | ICD-10-CM | POA: Diagnosis not present

## 2020-12-23 DIAGNOSIS — Z03818 Encounter for observation for suspected exposure to other biological agents ruled out: Secondary | ICD-10-CM | POA: Diagnosis not present

## 2020-12-29 ENCOUNTER — Other Ambulatory Visit: Payer: Self-pay | Admitting: Internal Medicine

## 2021-01-06 ENCOUNTER — Ambulatory Visit: Payer: BC Managed Care – PPO | Admitting: Family Medicine

## 2021-01-06 ENCOUNTER — Other Ambulatory Visit: Payer: Self-pay

## 2021-01-06 DIAGNOSIS — J4530 Mild persistent asthma, uncomplicated: Secondary | ICD-10-CM

## 2021-01-06 DIAGNOSIS — I10 Essential (primary) hypertension: Secondary | ICD-10-CM

## 2021-01-06 MED ORDER — FLUTICASONE-SALMETEROL 113-14 MCG/ACT IN AEPB: 1.0000 | INHALATION_SPRAY | Freq: Two times a day (BID) | RESPIRATORY_TRACT | 2 refills | Status: DC

## 2021-01-06 NOTE — Assessment & Plan Note (Signed)
Adequate control.  Continue amlodipine 5 mg once daily. 

## 2021-01-06 NOTE — Patient Instructions (Signed)
Nice to see you. We will see if the fluticasone/salmeterol is affordable.  If it is not please let me know.

## 2021-01-06 NOTE — Progress Notes (Signed)
Marikay Alar, MD Phone: [phone]  William Gentry is a 50 y.o. male who presents today for follow-up.  Hypertension: Reports this is generally well controlled at home.  No chest pain, shortness with, or edema.  He is taking amlodipine.  Notes he has altered his diet and is going to get back to exercising.  Asthma: He notes his Elwin Sleight cost was excessive.  He has been using his albuterol 3-4 times a week and has been using this for cough and occasional wheezing.  No shortness of breath.  Social History   Tobacco Use  Smoking Status Former   Types: Cigarettes   Quit date: 2010   Years since quitting: 12.6  Smokeless Tobacco Current    Current Outpatient Medications on File Prior to Visit  Medication Sig Dispense Refill   amLODipine (NORVASC) 5 MG tablet TAKE 1 TABLET BY MOUTH EVERY DAY 30 tablet 11   diclofenac (VOLTAREN) 75 MG EC tablet Take 1 tablet (75 mg total) by mouth 2 (two) times daily as needed. 30 tablet 0   diphenhydrAMINE (BENADRYL) 25 mg capsule Take 1 capsule (25 mg total) by mouth every 4 (four) hours as needed. 30 capsule 2   nicotine (NICODERM CQ - DOSED IN MG/24 HOURS) 21 mg/24hr patch PLACE 1 PATCH (21 MG TOTAL) ONTO THE SKIN DAILY. CALL FOR NEXT STEP PATCH. 42 patch 0   nicotine polacrilex (COMMIT) 2 MG lozenge TAKE 1 LOZENGE (2 MG TOTAL) BY MOUTH AS NEEDED FOR SMOKING CESSATION. 135 lozenge 0   polyethylene glycol-electrolytes (GAVILYTE-N WITH FLAVOR PACK) 420 g solution Drink one 8 oz glass every 20 mins until entire container is finished starting at 5:00pm on 09/29/20 (Patient not taking: No sig reported) 4000 mL 0   rosuvastatin (CRESTOR) 40 MG tablet Take 1 tablet (40 mg total) by mouth daily. 90 tablet 0   VENTOLIN HFA 108 (90 Base) MCG/ACT inhaler INHALE 1-2 PUFFS BY MOUTH EVERY 6 HOURS AS NEEDED FOR WHEEZE OR SHORTNESS OF BREATH 18 each 1   No current facility-administered medications on file prior to visit.     ROS see history of present  illness  Objective  Physical Exam Vitals:   01/06/21 1539  BP: 112/72  Pulse: 90  Resp: 16  Temp: 97.9 F (36.6 C)  SpO2: 97%    BP Readings from Last 3 Encounters:  01/06/21 112/72  11/25/20 118/72  09/30/20 116/85   Wt Readings from Last 3 Encounters:  01/06/21 268 lb (121.6 kg)  11/25/20 266 lb 6 oz (120.8 kg)  09/30/20 261 lb (118.4 kg)    Physical Exam Constitutional:      General: He is not in acute distress.    Appearance: He is not diaphoretic.  Cardiovascular:     Rate and Rhythm: Normal rate and regular rhythm.     Heart sounds: Normal heart sounds.  Pulmonary:     Effort: Pulmonary effort is normal.     Breath sounds: Normal breath sounds.  Skin:    General: Skin is warm and dry.  Neurological:     Mental Status: He is alert.     Assessment/Plan: Please see individual problem list.  Problem List Items Addressed This Visit     Asthma    The patient continues to use his albuterol too frequently.  We will send in a fluticasone/salmeterol respite click inhaler for him to use.  If this is not affordable he will let us know.  He was provided with a good Rx coupon.  If  this is unaffordable we could try Singulair and see if that is beneficial.  He can continue the albuterol as needed though I did discuss if he gets on the fluticasone/salmeterol he hopefully will not need the albuterol as much.      Relevant Medications   Fluticasone-Salmeterol 113-14 MCG/ACT AEPB   Essential hypertension    Adequate control.  Continue amlodipine 5 mg once daily.       Return in about 3 months (around 04/08/2021) for Asthma.  This visit occurred during the SARS-CoV-2 public health emergency.  Safety protocols were in place, including screening questions prior to the visit, additional usage of staff PPE, and extensive cleaning of exam room while observing appropriate contact time as indicated for disinfecting solutions.    Marikay Alar, MD [facility] Primary Care [facility]

## 2021-01-06 NOTE — Assessment & Plan Note (Signed)
The patient continues to use his albuterol too frequently.  We will send in a fluticasone/salmeterol respite click inhaler for him to use.  If this is not affordable he will let us know.  He was provided with a good Rx coupon.  If this is unaffordable we could try Singulair and see if that is beneficial.  He can continue the albuterol as needed though I did discuss if he gets on the fluticasone/salmeterol he hopefully will not need the albuterol as much.

## 2021-01-21 ENCOUNTER — Other Ambulatory Visit: Payer: Self-pay | Admitting: Family Medicine

## 2021-01-21 DIAGNOSIS — E785 Hyperlipidemia, unspecified: Secondary | ICD-10-CM

## 2021-01-24 ENCOUNTER — Other Ambulatory Visit: Payer: Self-pay | Admitting: Family Medicine

## 2021-01-25 MED ORDER — NICOTINE 14 MG/24HR TD PT24: 14.0000 mg | MEDICATED_PATCH | Freq: Every day | TRANSDERMAL | 0 refills | Status: DC

## 2021-01-25 NOTE — Telephone Encounter (Signed)
His Crestor was sent in yesterday at the appropriate dose of 40 mg once daily.  I sent the new nicotine patch in for him at the lower dose.  He should contact us in 2 weeks when he needs the next patch.

## 2021-01-31 NOTE — Telephone Encounter (Signed)
Called and spoke to [name]. William Gentry verbalized understanding and had no further questions.

## 2021-02-15 ENCOUNTER — Other Ambulatory Visit: Payer: Self-pay | Admitting: Family Medicine

## 2021-03-20 ENCOUNTER — Other Ambulatory Visit: Payer: Self-pay | Admitting: Family Medicine

## 2021-03-22 ENCOUNTER — Other Ambulatory Visit: Payer: Self-pay | Admitting: Family Medicine

## 2021-04-10 ENCOUNTER — Ambulatory Visit: Payer: BC Managed Care – PPO | Admitting: Family Medicine

## 2021-04-11 ENCOUNTER — Ambulatory Visit (INDEPENDENT_AMBULATORY_CARE_PROVIDER_SITE_OTHER): Payer: BC Managed Care – PPO | Admitting: Family Medicine

## 2021-04-11 ENCOUNTER — Other Ambulatory Visit: Payer: Self-pay

## 2021-04-11 ENCOUNTER — Encounter: Payer: Self-pay | Admitting: Family Medicine

## 2021-04-11 DIAGNOSIS — I10 Essential (primary) hypertension: Secondary | ICD-10-CM

## 2021-04-11 DIAGNOSIS — J4530 Mild persistent asthma, uncomplicated: Secondary | ICD-10-CM | POA: Diagnosis not present

## 2021-04-11 NOTE — Progress Notes (Signed)
Marikay Alar, MD Phone: [phone]  William Gentry is a 50 y.o. male who presents today for f/u.  Asthma: Medication compliance- using his fluticasone-salmeterol once daily  Rescue inhaler use- 2x/week Dyspnea- occasionally  Wheezing- occasionally   Cough- no   The daily inhaler has been helpful.  He does report a URI a week or so ago though he has mostly recovered from this.   HYPERTENSION Disease Monitoring Home BP Monitoring 120s/80 Chest pain- no    Dyspnea- see above Medications Compliance-  taking amlodipine.  Edema- no BMET    Component Value Date/Time   NA 139 08/24/2020 0932   NA 137 03/21/2016 0842   K 4.2 08/24/2020 0932   CL 103 08/24/2020 0932   CO2 29 08/24/2020 0932   GLUCOSE 106 (H) 08/24/2020 0932   BUN 11 08/24/2020 0932   BUN 13 03/21/2016 0842   CREATININE 0.72 08/24/2020 0932   CREATININE 0.81 02/20/2019 1510   CALCIUM 9.9 08/24/2020 0932   GFRNONAA 103 03/21/2016 0842   GFRAA 120 03/21/2016 0842      Social History   Tobacco Use  Smoking Status Former   Types: Cigarettes   Quit date: 2010   Years since quitting: 12.8  Smokeless Tobacco Current    Current Outpatient Medications on File Prior to Visit  Medication Sig Dispense Refill   amLODipine (NORVASC) 5 MG tablet TAKE 1 TABLET BY MOUTH EVERY DAY 30 tablet 11   Fluticasone-Salmeterol 113-14 MCG/ACT AEPB Inhale 1 puff into the lungs 2 (two) times daily. 1 each 2   nicotine (NICODERM CQ - DOSED IN MG/24 HOURS) 14 mg/24hr patch Place 1 patch (14 mg total) onto the skin daily. Call for next step patch once this prescription is completed. 14 patch 0   nicotine polacrilex (COMMIT) 2 MG lozenge TAKE 1 LOZENGE (2 MG TOTAL) BY MOUTH AS NEEDED FOR SMOKING CESSATION. 135 lozenge 0   polyethylene glycol-electrolytes (GAVILYTE-N WITH FLAVOR PACK) 420 g solution Drink one 8 oz glass every 20 mins until entire container is finished starting at 5:00pm on 09/29/20 4000 mL 0   rosuvastatin (CRESTOR) 40  MG tablet TAKE 1 TABLET BY MOUTH EVERY DAY 90 tablet 0   VENTOLIN HFA 108 (90 Base) MCG/ACT inhaler INHALE 1-2 PUFFS BY MOUTH EVERY 6 HOURS AS NEEDED FOR WHEEZE OR SHORTNESS OF BREATH 18 each 1   diclofenac (VOLTAREN) 75 MG EC tablet Take 1 tablet (75 mg total) by mouth 2 (two) times daily as needed. (Patient not taking: Reported on 04/11/2021) 30 tablet 0   diphenhydrAMINE (BENADRYL) 25 mg capsule Take 1 capsule (25 mg total) by mouth every 4 (four) hours as needed. 30 capsule 2   No current facility-administered medications on file prior to visit.     ROS see history of present illness  Objective  Physical Exam Vitals:   04/11/21 1141  BP: 120/84  Pulse: 85  Resp: 18  Temp: 98.1 F (36.7 C)  SpO2: 97%    BP Readings from Last 3 Encounters:  04/11/21 120/84  01/06/21 112/72  11/25/20 118/72   Wt Readings from Last 3 Encounters:  04/11/21 268 lb (121.6 kg)  01/06/21 268 lb (121.6 kg)  11/25/20 266 lb 6 oz (120.8 kg)    Physical Exam Constitutional:      General: He is not in acute distress.    Appearance: He is not diaphoretic.  Cardiovascular:     Rate and Rhythm: Normal rate and regular rhythm.     Heart sounds: Normal  heart sounds.  Pulmonary:     Effort: Pulmonary effort is normal.     Breath sounds: Normal breath sounds.  Skin:    General: Skin is warm and dry.  Neurological:     Mental Status: He is alert.     Assessment/Plan: Please see individual problem list.  Problem List Items Addressed This Visit     Asthma    The patient will start taking his fluticasone-salmeterol 1 puff twice daily.  Discussed this would be an appropriate dosing regimen and he should get additional benefit from the twice daily regimen of compared to the once daily he has been doing.  I did encourage him to quit smoking as that would be beneficial as well.  He will follow-up in 3 months.      Essential hypertension    Generally adequate control.  He will continue amlodipine 5  mg once daily.        Return in about 3 months (around 07/12/2021) for Asthma.  This visit occurred during the SARS-CoV-2 public health emergency.  Safety protocols were in place, including screening questions prior to the visit, additional usage of staff PPE, and extensive cleaning of exam room while observing appropriate contact time as indicated for disinfecting solutions.    Marikay Alar, MD [facility]

## 2021-04-11 NOTE — Assessment & Plan Note (Signed)
Generally adequate control.  He will continue amlodipine 5 mg once daily.

## 2021-04-11 NOTE — Patient Instructions (Addendum)
Nice to see you. Please try using the fluticasone-salmeterol inhaler 1 puff twice daily. If this does not provide any additional benefit over the next several weeks please let us know.

## 2021-04-11 NOTE — Assessment & Plan Note (Signed)
The patient will start taking his fluticasone-salmeterol 1 puff twice daily.  Discussed this would be an appropriate dosing regimen and he should get additional benefit from the twice daily regimen of compared to the once daily he has been doing.  I did encourage him to quit smoking as that would be beneficial as well.  He will follow-up in 3 months.

## 2021-04-18 ENCOUNTER — Other Ambulatory Visit: Payer: Self-pay | Admitting: Family Medicine

## 2021-04-20 ENCOUNTER — Other Ambulatory Visit: Payer: Self-pay | Admitting: Family Medicine

## 2021-04-20 DIAGNOSIS — E785 Hyperlipidemia, unspecified: Secondary | ICD-10-CM

## 2021-05-06 ENCOUNTER — Other Ambulatory Visit: Payer: Self-pay | Admitting: Family Medicine

## 2021-05-06 DIAGNOSIS — J4530 Mild persistent asthma, uncomplicated: Secondary | ICD-10-CM

## 2021-06-12 ENCOUNTER — Other Ambulatory Visit: Payer: Self-pay | Admitting: Family Medicine

## 2021-06-22 ENCOUNTER — Other Ambulatory Visit: Payer: Self-pay | Admitting: Family Medicine

## 2021-07-19 ENCOUNTER — Ambulatory Visit: Payer: BC Managed Care – PPO | Admitting: Family Medicine

## 2021-07-19 ENCOUNTER — Encounter: Payer: Self-pay | Admitting: Family Medicine

## 2021-07-19 ENCOUNTER — Other Ambulatory Visit: Payer: Self-pay

## 2021-07-19 DIAGNOSIS — M25571 Pain in right ankle and joints of right foot: Secondary | ICD-10-CM

## 2021-07-19 DIAGNOSIS — I10 Essential (primary) hypertension: Secondary | ICD-10-CM

## 2021-07-19 DIAGNOSIS — F17200 Nicotine dependence, unspecified, uncomplicated: Secondary | ICD-10-CM

## 2021-07-19 DIAGNOSIS — J4530 Mild persistent asthma, uncomplicated: Secondary | ICD-10-CM | POA: Diagnosis not present

## 2021-07-19 MED ORDER — MELOXICAM 7.5 MG PO TABS: 7.5000 mg | ORAL_TABLET | Freq: Every day | ORAL | 0 refills | Status: DC | PRN

## 2021-07-19 NOTE — Patient Instructions (Signed)
Nice to see you. We will see if meloxicam is helpful for your ankle. You could continue to use heat or ice. Please continue wearing your brace.

## 2021-07-19 NOTE — Assessment & Plan Note (Signed)
Well-controlled.  He has recovered from his recent illness.  He will monitor for any recurrent symptoms.  He will continue his fluticasone salmeterol.  He can continue albuterol as needed.

## 2021-07-19 NOTE — Progress Notes (Signed)
[name], MD Phone: [phone]  William Gentry is a 51 y.o. male who presents today for f/u.  HYPERTENSION Disease Monitoring Home BP Monitoring not checking Chest pain- no    Dyspnea- no Medications Compliance-  taking amlodipine.   Edema- no BMET    Component Value Date/Time   NA 139 08/24/2020 0932   NA 137 03/21/2016 0842   K 4.2 08/24/2020 0932   CL 103 08/24/2020 0932   CO2 29 08/24/2020 0932   GLUCOSE 106 (H) 08/24/2020 0932   BUN 11 08/24/2020 0932   BUN 13 03/21/2016 0842   CREATININE 0.72 08/24/2020 0932   CREATININE 0.81 02/20/2019 1510   CALCIUM 9.9 08/24/2020 0932   GFRNONAA 103 03/21/2016 0842   GFRAA 120 03/21/2016 0842   Tobacco abuse: Patient notes he quit using dip though continues to smoke 4 to 5 cigarettes a day.  He continues on the 21 mg nicotine patch as he feels as though this works better for him.  Asthma: Patient notes he is taking his fluticasone/salmeterol daily.  He had a recent illness with cough and congestion.  Notes that has been clearing up.  He was using his albuterol more when he had that though otherwise does not typically use his albuterol.  Right ankle pain: Notes this has been going on a couple of months.  Notes no injury.  There is some soreness medially.  Has been improving to a certain degree.  Now if he moves it a certain way he will get a sharp discomfort.  He was having discomfort when walking previously.  He has been using a brace and heat.  He also tried Aleve and ibuprofen with no benefit.  Social History   Tobacco Use  Smoking Status Former   Types: Cigarettes   Quit date: 2010   Years since quitting: 13.1  Smokeless Tobacco Current    Current Outpatient Medications on File Prior to Visit  Medication Sig Dispense Refill   amLODipine (NORVASC) 5 MG tablet TAKE 1 TABLET BY MOUTH EVERY DAY 30 tablet 11   Fluticasone-Salmeterol 113-14 MCG/ACT AEPB INHALE 1 PUFF INTO THE LUNGS TWICE A DAY 1 each 2   nicotine  (NICODERM CQ - DOSED IN MG/24 HOURS) 14 mg/24hr patch Place 1 patch (14 mg total) onto the skin daily. Call for next step patch. 14 patch 0   nicotine polacrilex (COMMIT) 2 MG lozenge TAKE 1 LOZENGE (2 MG TOTAL) BY MOUTH AS NEEDED FOR SMOKING CESSATION. 135 lozenge 0   rosuvastatin (CRESTOR) 40 MG tablet TAKE 1 TABLET BY MOUTH EVERY DAY 90 tablet 0   VENTOLIN HFA 108 (90 Base) MCG/ACT inhaler INHALE 1-2 PUFFS BY MOUTH EVERY 6 HOURS AS NEEDED FOR WHEEZE OR SHORTNESS OF BREATH 18 each 1   No current facility-administered medications on file prior to visit.     ROS see history of present illness  Objective  Physical Exam Vitals:   07/19/21 1612  BP: 120/78  Pulse: 82  Resp: 14  Temp: 98.3 F (36.8 C)  SpO2: 94%    BP Readings from Last 3 Encounters:  07/19/21 120/78  04/11/21 120/84  01/06/21 112/72   Wt Readings from Last 3 Encounters:  07/19/21 269 lb 6.4 oz (122.2 kg)  04/11/21 268 lb (121.6 kg)  01/06/21 268 lb (121.6 kg)    Physical Exam Constitutional:      General: He is not in acute distress.    Appearance: He is not diaphoretic.  Cardiovascular:     Rate and  Rhythm: Normal rate and regular rhythm.     Heart sounds: Normal heart sounds.  Pulmonary:     Effort: Pulmonary effort is normal.     Breath sounds: Normal breath sounds.  Musculoskeletal:       Feet:  Skin:    General: Skin is warm and dry.  Neurological:     Mental Status: He is alert.     Assessment/Plan: Please see individual problem list.  Problem List Items Addressed This Visit     Asthma    Well-controlled.  He has recovered from his recent illness.  He will monitor for any recurrent symptoms.  He will continue his fluticasone salmeterol.  He can continue albuterol as needed.      Essential hypertension    Well-controlled.  He will continue amlodipine 5 mg once daily.      Nicotine dependence    The patient quit using dip though continues to smoke 4 to 5 cigarettes/day.  I  discussed that he should quit.  Discussed that he needs to taper down to the 14 mg patch.  He will continue to work on quitting.      Right ankle pain    Possible soft tissue strain.  He has no bony tenderness.  Discussed a trial of meloxicam daily for 5 days with food.  Discussed ice and heat.  Discussed using a brace.  If its not improving after 5 days of meloxicam he will let us know.  Discussed if he develops stomach irritation with the meloxicam he should let us know and discontinue the medication.        Return in about 6 months (around 01/16/2022) for htn, asthma.  This visit occurred during the SARS-CoV-2 public health emergency.  Safety protocols were in place, including screening questions prior to the visit, additional usage of staff PPE, and extensive cleaning of exam room while observing appropriate contact time as indicated for disinfecting solutions.    [name], MD [place]

## 2021-07-19 NOTE — Assessment & Plan Note (Signed)
Possible soft tissue strain.  He has no bony tenderness.  Discussed a trial of meloxicam daily for 5 days with food.  Discussed ice and heat.  Discussed using a brace.  If its not improving after 5 days of meloxicam he will let us know.  Discussed if he develops stomach irritation with the meloxicam he should let us know and discontinue the medication.

## 2021-07-19 NOTE — Assessment & Plan Note (Signed)
The patient quit using dip though continues to smoke 4 to 5 cigarettes/day.  I discussed that he should quit.  Discussed that he needs to taper down to the 14 mg patch.  He will continue to work on quitting.

## 2021-07-19 NOTE — Assessment & Plan Note (Signed)
Well-controlled.  He will continue amlodipine 5 mg once daily. °

## 2021-08-10 ENCOUNTER — Telehealth: Payer: Self-pay | Admitting: Family Medicine

## 2021-08-10 NOTE — Telephone Encounter (Signed)
[facility] pharmacy called and  need to have a prescription sent for his VENTOLIN HFA 108 (90 Base) MCG/ACT inhaler. ?

## 2021-08-11 ENCOUNTER — Other Ambulatory Visit: Payer: Self-pay | Admitting: Family Medicine

## 2021-08-11 ENCOUNTER — Other Ambulatory Visit: Payer: Self-pay

## 2021-08-11 DIAGNOSIS — J4530 Mild persistent asthma, uncomplicated: Secondary | ICD-10-CM

## 2021-08-11 MED ORDER — VENTOLIN HFA 108 (90 BASE) MCG/ACT IN AERS: INHALATION_SPRAY | RESPIRATORY_TRACT | 1 refills | Status: DC

## 2021-08-15 ENCOUNTER — Other Ambulatory Visit: Payer: Self-pay

## 2021-08-15 DIAGNOSIS — J4530 Mild persistent asthma, uncomplicated: Secondary | ICD-10-CM

## 2021-08-15 MED ORDER — VENTOLIN HFA 108 (90 BASE) MCG/ACT IN AERS: INHALATION_SPRAY | RESPIRATORY_TRACT | 1 refills | Status: DC

## 2021-08-28 ENCOUNTER — Other Ambulatory Visit: Payer: Self-pay | Admitting: Family Medicine

## 2021-08-28 DIAGNOSIS — J4530 Mild persistent asthma, uncomplicated: Secondary | ICD-10-CM

## 2021-08-28 MED ORDER — VENTOLIN HFA 108 (90 BASE) MCG/ACT IN AERS: INHALATION_SPRAY | RESPIRATORY_TRACT | 1 refills | Status: DC

## 2021-10-05 ENCOUNTER — Other Ambulatory Visit: Payer: Self-pay | Admitting: Family Medicine

## 2021-10-05 DIAGNOSIS — J4530 Mild persistent asthma, uncomplicated: Secondary | ICD-10-CM

## 2021-10-09 ENCOUNTER — Other Ambulatory Visit: Payer: Self-pay | Admitting: Family Medicine

## 2021-10-09 DIAGNOSIS — J4530 Mild persistent asthma, uncomplicated: Secondary | ICD-10-CM

## 2021-11-02 ENCOUNTER — Other Ambulatory Visit: Payer: Self-pay | Admitting: Family Medicine

## 2021-11-02 DIAGNOSIS — E785 Hyperlipidemia, unspecified: Secondary | ICD-10-CM

## 2021-11-26 ENCOUNTER — Other Ambulatory Visit: Payer: Self-pay | Admitting: Family Medicine

## 2021-11-26 DIAGNOSIS — J4530 Mild persistent asthma, uncomplicated: Secondary | ICD-10-CM

## 2022-01-17 ENCOUNTER — Ambulatory Visit: Payer: BC Managed Care – PPO | Admitting: Family Medicine

## 2022-01-17 ENCOUNTER — Encounter: Payer: Self-pay | Admitting: Family Medicine

## 2022-01-17 DIAGNOSIS — J4530 Mild persistent asthma, uncomplicated: Secondary | ICD-10-CM | POA: Diagnosis not present

## 2022-01-17 DIAGNOSIS — E7800 Pure hypercholesterolemia, unspecified: Secondary | ICD-10-CM

## 2022-01-17 DIAGNOSIS — K0889 Other specified disorders of teeth and supporting structures: Secondary | ICD-10-CM

## 2022-01-17 DIAGNOSIS — R7303 Prediabetes: Secondary | ICD-10-CM | POA: Diagnosis not present

## 2022-01-17 DIAGNOSIS — I10 Essential (primary) hypertension: Secondary | ICD-10-CM | POA: Diagnosis not present

## 2022-01-17 MED ORDER — AMOXICILLIN 500 MG PO CAPS: 500.0000 mg | ORAL_CAPSULE | Freq: Three times a day (TID) | ORAL | 0 refills | Status: AC

## 2022-01-17 NOTE — Assessment & Plan Note (Signed)
Check A1c. 

## 2022-01-17 NOTE — Assessment & Plan Note (Signed)
Possibly with infection.  He needs to see his dentist to consider removal of the tooth if they advise that.  We will treat with amoxicillin 500 mg every 8 hours.

## 2022-01-17 NOTE — Patient Instructions (Signed)
Nice to see you. Please see your dentist as planned. We will contact you with your lab results.

## 2022-01-17 NOTE — Progress Notes (Signed)
[name], MD Phone: [phone]  William Gentry is a 51 y.o. male who presents today for follow-up.  Hypertension: Patient reports generally well controlled though does occasionally spike up as he has had a lot of dental pain.  He takes amlodipine.  No chest pain, shortness of breath, or edema.  Asthma: He notes he has been off of his fluticasone-salmeterol inhaler for the last few weeks.  He was previously taking it daily.  He notes he just got out of the habit of using it.  He uses the albuterol 2-3 times a week.  No cough or wheezing.  Hyperlipidemia: Taking Crestor.  No right upper quadrant pain or myalgias.  Dental issues: Patient reports posterior right lower tooth discomfort that has been going on for a month and a half.  He notes he has periodontal disease.  He has had issues with lots of gum infections and he was recently on amoxicillin and notes that helped quite a bit.  He has not been on the amoxicillin over the last week or so.  He has had recurrence of dental pain since going off amoxicillin.  He is scheduled for tooth extraction this weekend.  Social History   Tobacco Use  Smoking Status Former   Types: Cigarettes   Quit date: 2010   Years since quitting: 13.6  Smokeless Tobacco Current    Current Outpatient Medications on File Prior to Visit  Medication Sig Dispense Refill   albuterol (VENTOLIN HFA) 108 (90 Base) MCG/ACT inhaler INHALE 1-2 PUFFS BY MOUTH EVERY 6 HOURS AS NEEDED FOR WHEEZE OR SHORTNESS OF BREATH 18 each 1   amLODipine (NORVASC) 5 MG tablet TAKE 1 TABLET BY MOUTH EVERY DAY 30 tablet 11   Fluticasone-Salmeterol 113-14 MCG/ACT AEPB INHALE 1 PUFF INTO THE LUNGS TWICE A DAY 1 each 2   meloxicam (MOBIC) 7.5 MG tablet Take 1 tablet (7.5 mg total) by mouth daily as needed for pain. 10 tablet 0   nicotine (NICODERM CQ - DOSED IN MG/24 HOURS) 14 mg/24hr patch Place 1 patch (14 mg total) onto the skin daily. Call for next step patch. 14 patch 0   nicotine  polacrilex (COMMIT) 2 MG lozenge TAKE 1 LOZENGE (2 MG TOTAL) BY MOUTH AS NEEDED FOR SMOKING CESSATION. 135 lozenge 0   rosuvastatin (CRESTOR) 40 MG tablet TAKE 1 TABLET BY MOUTH EVERY DAY 90 tablet 0   VENTOLIN HFA 108 (90 Base) MCG/ACT inhaler INHALE 1-2 PUFFS BY MOUTH EVERY 6 HOURS AS NEEDED FOR WHEEZE OR SHORTNESS OF BREATH 18 each 1   No current facility-administered medications on file prior to visit.     ROS see history of present illness  Objective  Physical Exam Vitals:   01/17/22 1543  BP: 130/70  Pulse: 90  Temp: 98.5 F (36.9 C)  SpO2: 95%    BP Readings from Last 3 Encounters:  01/17/22 130/70  07/19/21 120/78  04/11/21 120/84   Wt Readings from Last 3 Encounters:  01/17/22 271 lb 9.6 oz (123.2 kg)  07/19/21 269 lb 6.4 oz (122.2 kg)  04/11/21 268 lb (121.6 kg)    Physical Exam Constitutional:      General: He is not in acute distress.    Appearance: He is not diaphoretic.  HENT:     Mouth/Throat:     Comments: Poor dentition, right posterior lower tooth with evidence of erosion of the base of the tooth, no noted drainage, some discomfort on palpation with tongue depressor Cardiovascular:     Rate and  Rhythm: Normal rate and regular rhythm.     Heart sounds: Normal heart sounds.  Pulmonary:     Effort: Pulmonary effort is normal.     Breath sounds: Normal breath sounds.  Skin:    General: Skin is warm and dry.  Neurological:     Mental Status: He is alert.      Assessment/Plan: Please see individual problem list.  Problem List Items Addressed This Visit     Asthma (Chronic)    Slight wheezing on exam.  I encouraged him to restart his fluticasone-salmeterol inhaler 1 puff twice daily.  He can continue as needed albuterol use.      Essential hypertension (Chronic)    Adequate control.  He will continue amlodipine 5 mg daily.  His spikes in blood pressure likely related to his dental pain which will hopefully come under better control as his  dentist removes the tooth.      Relevant Orders   Comp Met (CMET)   Prediabetes (Chronic)    Check A1c.      Relevant Orders   HgB A1c   Pure hypercholesterolemia (Chronic)    Check lipid panel.  Continue Crestor 40 mg daily.      Relevant Orders   Lipid panel   Comp Met (CMET)   Pain, dental    Possibly with infection.  He needs to see his dentist to consider removal of the tooth if they advise that.  We will treat with amoxicillin 500 mg every 8 hours.      Relevant Medications   amoxicillin (AMOXIL) 500 MG capsule     Return in about 6 months (around 07/20/2022) for Hypertension/asthma.   [name], MD [place]

## 2022-01-17 NOTE — Assessment & Plan Note (Signed)
Slight wheezing on exam.  I encouraged him to restart his fluticasone-salmeterol inhaler 1 puff twice daily.  He can continue as needed albuterol use.

## 2022-01-17 NOTE — Assessment & Plan Note (Signed)
Adequate control.  He will continue amlodipine 5 mg daily.  His spikes in blood pressure likely related to his dental pain which will hopefully come under better control as his dentist removes the tooth.

## 2022-01-17 NOTE — Assessment & Plan Note (Signed)
Check lipid panel.  Continue Crestor 40 mg daily. 

## 2022-01-18 LAB — COMPREHENSIVE METABOLIC PANEL
ALT: 15 U/L (ref 0–53)
AST: 16 U/L (ref 0–37)
Albumin: 4.3 g/dL (ref 3.5–5.2)
Alkaline Phosphatase: 38 U/L — ABNORMAL LOW (ref 39–117)
BUN: 14 mg/dL (ref 6–23)
CO2: 24 mEq/L (ref 19–32)
Calcium: 9.2 mg/dL (ref 8.4–10.5)
Chloride: 105 mEq/L (ref 96–112)
Creatinine, Ser: 0.89 mg/dL (ref 0.40–1.50)
GFR: 98.37 mL/min (ref >60.00)
Glucose, Bld: 112 mg/dL — ABNORMAL HIGH (ref 70–99)
Potassium: 3.7 mEq/L (ref 3.5–5.1)
Sodium: 138 mEq/L (ref 135–145)
Total Bilirubin: 0.3 mg/dL (ref 0.2–1.2)
Total Protein: 7.3 g/dL (ref 6.0–8.3)

## 2022-01-18 LAB — LIPID PANEL
Cholesterol: 177 mg/dL (ref 0–200)
HDL: 36 mg/dL — ABNORMAL LOW (ref >39.00)
LDL Cholesterol: 113 mg/dL — ABNORMAL HIGH (ref 0–99)
NonHDL: 141
Total CHOL/HDL Ratio: 5
Triglycerides: 138 mg/dL (ref 0.0–149.0)
VLDL: 27.6 mg/dL (ref 0.0–40.0)

## 2022-01-18 LAB — HEMOGLOBIN A1C: Hgb A1c MFr Bld: 6.4 % (ref 4.6–6.5)

## 2022-03-17 ENCOUNTER — Other Ambulatory Visit: Payer: Self-pay | Admitting: Family Medicine

## 2022-03-17 DIAGNOSIS — J4530 Mild persistent asthma, uncomplicated: Secondary | ICD-10-CM

## 2022-04-26 ENCOUNTER — Other Ambulatory Visit: Payer: Self-pay | Admitting: Family Medicine

## 2022-06-27 ENCOUNTER — Other Ambulatory Visit: Payer: Self-pay | Admitting: Family Medicine

## 2022-06-27 DIAGNOSIS — J4530 Mild persistent asthma, uncomplicated: Secondary | ICD-10-CM

## 2022-07-20 ENCOUNTER — Ambulatory Visit: Payer: BC Managed Care – PPO | Admitting: Family Medicine

## 2022-10-19 ENCOUNTER — Other Ambulatory Visit: Payer: Self-pay | Admitting: Family Medicine

## 2022-10-19 DIAGNOSIS — J4530 Mild persistent asthma, uncomplicated: Secondary | ICD-10-CM

## 2022-10-22 NOTE — Telephone Encounter (Signed)
Patient needs follow-up scheduled for additional refills. Once he schedules follow-up a refill can be sent in.

## 2022-11-08 NOTE — Telephone Encounter (Signed)
Patient scheduled for 11/19/22 at 2:15.

## 2022-11-19 ENCOUNTER — Encounter: Payer: Self-pay | Admitting: Family Medicine

## 2022-11-19 ENCOUNTER — Ambulatory Visit: Payer: BC Managed Care – PPO | Admitting: Family Medicine

## 2022-11-19 VITALS — BP 130/82 | HR 77 | Temp 98.3°F | Ht 72.0 in | Wt 267.8 lb

## 2022-11-19 DIAGNOSIS — R7303 Prediabetes: Secondary | ICD-10-CM | POA: Diagnosis not present

## 2022-11-19 DIAGNOSIS — I10 Essential (primary) hypertension: Secondary | ICD-10-CM | POA: Diagnosis not present

## 2022-11-19 DIAGNOSIS — J4530 Mild persistent asthma, uncomplicated: Secondary | ICD-10-CM | POA: Diagnosis not present

## 2022-11-19 MED ORDER — ALBUTEROL SULFATE HFA 108 (90 BASE) MCG/ACT IN AERS: INHALATION_SPRAY | RESPIRATORY_TRACT | 3 refills | Status: DC

## 2022-11-19 NOTE — Assessment & Plan Note (Signed)
Chronic issue.  Well-controlled at this time.  He will continue fluticasone-salmeterol.  He can continue as needed albuterol use.

## 2022-11-19 NOTE — Assessment & Plan Note (Signed)
Encouraged healthier diet and reduction of sugary drinks.  Check A1c.

## 2022-11-19 NOTE — Assessment & Plan Note (Addendum)
Chronic issue.  Well-controlled at home.  Continue amlodipine 5 mg daily.  Intermittent issues with pedal edema may be related to the amlodipine.

## 2022-11-19 NOTE — Progress Notes (Signed)
Marikay Alar, MD Phone: [phone]  William Gentry is a 52 y.o. male who presents today for f/u.  HYPERTENSION Disease Monitoring Home BP Monitoring typically <130/80 Chest pain- no    Dyspnea- no Medications Compliance-  taking amlodipine.  Edema-notes no change to chronic occasional pedal edema BMET    Component Value Date/Time   NA 138 01/17/2022 1600   NA 137 03/21/2016 0842   K 3.7 01/17/2022 1600   CL 105 01/17/2022 1600   CO2 24 01/17/2022 1600   GLUCOSE 112 (H) 01/17/2022 1600   BUN 14 01/17/2022 1600   BUN 13 03/21/2016 0842   CREATININE 0.89 01/17/2022 1600   CREATININE 0.81 02/20/2019 1510   CALCIUM 9.2 01/17/2022 1600   GFRNONAA 103 03/21/2016 0842   GFRAA 120 03/21/2016 0842   Asthma: Medication compliance- using fluticasone-salmeterol  Rescue inhaler use- rare Dyspnea- no  Wheezing- no  Cough- no    Prediabetes: Patient notes his diet is not being great recently.  He has not been doing a good job when sugary drinks.   Social History   Tobacco Use  Smoking Status Former   Types: Cigarettes   Quit date: 2010   Years since quitting: 14.4  Smokeless Tobacco Current    Current Outpatient Medications on File Prior to Visit  Medication Sig Dispense Refill   amLODipine (NORVASC) 5 MG tablet TAKE 1 TABLET BY MOUTH EVERY DAY 90 tablet 3   Fluticasone-Salmeterol 113-14 MCG/ACT AEPB INHALE 1 PUFF INTO THE LUNGS TWICE A DAY 1 each 2   meloxicam (MOBIC) 7.5 MG tablet Take 1 tablet (7.5 mg total) by mouth daily as needed for pain. 10 tablet 0   nicotine (NICODERM CQ - DOSED IN MG/24 HOURS) 14 mg/24hr patch Place 1 patch (14 mg total) onto the skin daily. Call for next step patch. 14 patch 0   nicotine polacrilex (COMMIT) 2 MG lozenge TAKE 1 LOZENGE (2 MG TOTAL) BY MOUTH AS NEEDED FOR SMOKING CESSATION. 135 lozenge 0   rosuvastatin (CRESTOR) 40 MG tablet TAKE 1 TABLET BY MOUTH EVERY DAY 90 tablet 0   No current facility-administered medications on file prior  to visit.     ROS see history of present illness  Objective  Physical Exam Vitals:   11/19/22 1442 11/19/22 1501  BP: 132/86 130/82  Pulse: 77   Temp: 98.3 F (36.8 C)   SpO2: 93%     BP Readings from Last 3 Encounters:  11/19/22 130/82  01/17/22 130/70  07/19/21 120/78   Wt Readings from Last 3 Encounters:  11/19/22 267 lb 12.8 oz (121.5 kg)  01/17/22 271 lb 9.6 oz (123.2 kg)  07/19/21 269 lb 6.4 oz (122.2 kg)    Physical Exam Constitutional:      General: He is not in acute distress.    Appearance: He is not diaphoretic.  Cardiovascular:     Rate and Rhythm: Normal rate and regular rhythm.     Heart sounds: Normal heart sounds.  Pulmonary:     Effort: Pulmonary effort is normal.     Breath sounds: Normal breath sounds.  Musculoskeletal:     Right lower leg: No edema.     Left lower leg: No edema.  Neurological:     Mental Status: He is alert.      Assessment/Plan: Please see individual problem list.  Mild persistent asthma without complication Assessment & Plan: Chronic issue.  Well-controlled at this time.  He will continue fluticasone-salmeterol.  He can continue as needed albuterol use.  Orders: -     Albuterol Sulfate HFA; INHALE 1-2 PUFFS BY MOUTH EVERY 6 HOURS AS NEEDED FOR WHEEZE OR SHORTNESS OF BREATH  Dispense: 8.5 each; Refill: 3  Essential hypertension Assessment & Plan: Chronic issue.  Well-controlled at home.  Continue amlodipine 5 mg daily.  Intermittent issues with pedal edema may be related to the amlodipine.  Orders: -     Basic metabolic panel  Prediabetes Assessment & Plan: Encouraged healthier diet and reduction of sugary drinks.  Check A1c.  Orders: -     Hemoglobin A1c      Return in about 6 months (around 05/21/2023).   Marikay Alar, MD [facility]

## 2022-11-20 LAB — BASIC METABOLIC PANEL
BUN: 14 mg/dL (ref 6–23)
CO2: 27 mEq/L (ref 19–32)
Calcium: 9.7 mg/dL (ref 8.4–10.5)
Chloride: 104 mEq/L (ref 96–112)
Creatinine, Ser: 0.97 mg/dL (ref 0.40–1.50)
GFR: 89.08 mL/min (ref >60.00)
Glucose, Bld: 84 mg/dL (ref 70–99)
Potassium: 4.3 mEq/L (ref 3.5–5.1)
Sodium: 141 mEq/L (ref 135–145)

## 2022-11-20 LAB — HEMOGLOBIN A1C: Hgb A1c MFr Bld: 6.1 % (ref 4.6–6.5)

## 2022-12-17 ENCOUNTER — Telehealth: Payer: Self-pay | Admitting: Family Medicine

## 2022-12-17 NOTE — Telephone Encounter (Signed)
Pt came into the office requesting medication for an infection in his gum. Pt stated he saw the provider for this 8 months ago and he was given an antibiotic. pt would like to be called

## 2022-12-18 NOTE — Telephone Encounter (Signed)
Left message to call the office back for details of what is going on with the Patient.

## 2022-12-20 NOTE — Telephone Encounter (Signed)
Attempted to call Patient-no answer or voicemail 

## 2022-12-21 NOTE — Telephone Encounter (Signed)
Called Patient he states that he seen the dentist and she took care of it.

## 2022-12-21 NOTE — Telephone Encounter (Signed)
Noted  

## 2023-01-04 ENCOUNTER — Other Ambulatory Visit: Payer: Self-pay | Admitting: Family Medicine

## 2023-01-04 DIAGNOSIS — E785 Hyperlipidemia, unspecified: Secondary | ICD-10-CM

## 2023-01-05 ENCOUNTER — Other Ambulatory Visit: Payer: Self-pay | Admitting: Family Medicine

## 2023-01-05 DIAGNOSIS — J4530 Mild persistent asthma, uncomplicated: Secondary | ICD-10-CM

## 2023-01-09 ENCOUNTER — Other Ambulatory Visit: Payer: Self-pay | Admitting: Family Medicine

## 2023-01-09 DIAGNOSIS — E785 Hyperlipidemia, unspecified: Secondary | ICD-10-CM

## 2023-03-15 ENCOUNTER — Telehealth: Payer: Self-pay | Admitting: Family Medicine

## 2023-03-15 ENCOUNTER — Other Ambulatory Visit: Payer: Self-pay

## 2023-03-15 DIAGNOSIS — E785 Hyperlipidemia, unspecified: Secondary | ICD-10-CM

## 2023-03-15 MED ORDER — ROSUVASTATIN CALCIUM 40 MG PO TABS
40.0000 mg | ORAL_TABLET | Freq: Every day | ORAL | 0 refills | Status: DC
Start: 2023-03-15 — End: 2023-06-10

## 2023-03-15 NOTE — Telephone Encounter (Signed)
Prescription sent to Pharmacy.  

## 2023-03-15 NOTE — Telephone Encounter (Signed)
Patient just walked in and said he needs a refill on his medication. The name is rosuvastatin (CRESTOR) 40 MG tablet. He is out. The pharmacy he use is [facility] [phone] [facility], [place] - [address] [place] [address] Phone: [phone]  Fax: [phone]  His number is [phone]

## 2023-03-21 ENCOUNTER — Other Ambulatory Visit: Payer: Self-pay | Admitting: Family Medicine

## 2023-03-21 DIAGNOSIS — J4530 Mild persistent asthma, uncomplicated: Secondary | ICD-10-CM

## 2023-06-03 ENCOUNTER — Telehealth: Payer: Self-pay | Admitting: Family Medicine

## 2023-06-03 ENCOUNTER — Ambulatory Visit: Payer: BC Managed Care – PPO | Admitting: Family Medicine

## 2023-06-03 NOTE — Telephone Encounter (Signed)
 Left message to call the office to reschedule due to weather

## 2023-06-10 ENCOUNTER — Other Ambulatory Visit: Payer: Self-pay | Admitting: Family Medicine

## 2023-06-10 DIAGNOSIS — E785 Hyperlipidemia, unspecified: Secondary | ICD-10-CM

## 2023-06-14 ENCOUNTER — Other Ambulatory Visit (HOSPITAL_COMMUNITY): Payer: Self-pay

## 2023-08-07 ENCOUNTER — Telehealth: Payer: Self-pay | Admitting: Family Medicine

## 2023-08-07 NOTE — Telephone Encounter (Signed)
 Dr Birdie Sons is no longer at this location. Please call the office to schedule a Transfer of Care to either Dr Charlann Lange, Darleen Crocker or Kara Dies, NP. [id number] please schedule pt.   Thank you

## 2023-10-04 ENCOUNTER — Other Ambulatory Visit: Payer: Self-pay | Admitting: Family Medicine

## 2023-10-04 DIAGNOSIS — J4530 Mild persistent asthma, uncomplicated: Secondary | ICD-10-CM

## 2023-10-04 NOTE — Telephone Encounter (Unsigned)
 Copied from CRM [id number]. Topic: Clinical - Medication Refill >> Oct 04, 2023 12:58 PM Adonis Hoot wrote: Medication: albuterol  (VENTOLIN  HFA) 108 (90 Base) MCG/ACT inhaler   Has the patient contacted their pharmacy? No (Agent: If no, request that the patient contact the pharmacy for the refill. If patient does not wish to contact the pharmacy document the reason why and proceed with request.) (Agent: If yes, when and what did the pharmacy advise?)   This is the patient's preferred pharmacy:  [facility] [phone] [facility], [place] - [address] [place] [address] Phone: [phone] Fax: [phone]       Is this the correct pharmacy for this prescription? Yes If no, delete pharmacy and type the correct one.    Has the prescription been filled recently? No   Is the patient out of the medication? Yes   Has the patient been seen for an appointment in the last year OR does the patient have an upcoming appointment? Yes TOC OCTOBER   Can we respond through MyChart? Yes   Agent: Please be advised that Rx refills may take up to 3 business days. We ask that you follow-up with your pharmacy.

## 2023-10-04 NOTE — Telephone Encounter (Signed)
 Copied from CRM [id number]. Topic: Clinical - Medication Refill >> Oct 04, 2023 12:58 PM Adonis Hoot wrote: Medication: albuterol  (VENTOLIN  HFA) 108 (90 Base) MCG/ACT inhaler   Has the patient contacted their pharmacy? No (Agent: If no, request that the patient contact the pharmacy for the refill. If patient does not wish to contact the pharmacy document the reason why and proceed with request.) (Agent: If yes, when and what did the pharmacy advise?)   This is the patient's preferred pharmacy:  [facility] [phone] [facility], [place] - [address] Tommi Fraise [place] [address] Phone: [phone] Fax: [phone]       Is this the correct pharmacy for this prescription? Yes If no, delete pharmacy and type the correct one.    Has the prescription been filled recently? No   Is the patient out of the medication? Yes   Has the patient been seen for an appointment in the last year OR does the patient have an upcoming appointment? Yes TOC OCTOBER   Can we respond through MyChart? Yes   Agent: Please be advised that Rx refills may take up to 3 business days. We ask that you follow-up with your pharmacy.

## 2023-10-08 MED ORDER — ALBUTEROL SULFATE HFA 108 (90 BASE) MCG/ACT IN AERS
INHALATION_SPRAY | RESPIRATORY_TRACT | 1 refills | Status: DC
Start: 2023-10-08 — End: 2023-12-09

## 2023-12-07 ENCOUNTER — Other Ambulatory Visit: Payer: Self-pay | Admitting: Internal Medicine

## 2023-12-07 DIAGNOSIS — J4530 Mild persistent asthma, uncomplicated: Secondary | ICD-10-CM

## 2024-01-18 ENCOUNTER — Encounter: Payer: Self-pay | Admitting: *Deleted

## 2024-01-18 ENCOUNTER — Other Ambulatory Visit: Payer: Self-pay

## 2024-01-18 ENCOUNTER — Emergency Department

## 2024-01-18 DIAGNOSIS — M25552 Pain in left hip: Secondary | ICD-10-CM | POA: Diagnosis not present

## 2024-01-18 DIAGNOSIS — M25512 Pain in left shoulder: Secondary | ICD-10-CM | POA: Diagnosis not present

## 2024-01-18 DIAGNOSIS — Y92410 Unspecified street and highway as the place of occurrence of the external cause: Secondary | ICD-10-CM | POA: Insufficient documentation

## 2024-01-18 DIAGNOSIS — M19012 Primary osteoarthritis, left shoulder: Secondary | ICD-10-CM | POA: Diagnosis not present

## 2024-01-18 DIAGNOSIS — S79912A Unspecified injury of left hip, initial encounter: Secondary | ICD-10-CM | POA: Diagnosis not present
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
## 2024-01-19 ENCOUNTER — Emergency Department
Admission: EM | Admit: 2024-01-19 | Discharge: 2024-01-19 | Disposition: A | Discharge status: Home / Self Care | Attending: Emergency Medicine | Admitting: Emergency Medicine

## 2024-01-19 ENCOUNTER — Emergency Department

## 2024-01-19 DIAGNOSIS — M25552 Pain in left hip: Secondary | ICD-10-CM

## 2024-01-19 DIAGNOSIS — M25512 Pain in left shoulder: Secondary | ICD-10-CM

## 2024-01-19 DIAGNOSIS — S79912A Unspecified injury of left hip, initial encounter: Secondary | ICD-10-CM | POA: Diagnosis not present

## 2024-01-19 MED ORDER — ALBUTEROL SULFATE HFA 108 (90 BASE) MCG/ACT IN AERS
1.0000 | INHALATION_SPRAY | Freq: Once | RESPIRATORY_TRACT | Status: AC
  Administered 2024-01-19: 1 via RESPIRATORY_TRACT
  Filled 2024-01-19: qty 6.7
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
## 2024-02-02 ENCOUNTER — Other Ambulatory Visit: Payer: Self-pay | Admitting: Internal Medicine

## 2024-02-02 DIAGNOSIS — J4530 Mild persistent asthma, uncomplicated: Secondary | ICD-10-CM

## 2024-02-06 DIAGNOSIS — R29898 Other symptoms and signs involving the musculoskeletal system: Secondary | ICD-10-CM | POA: Diagnosis not present

## 2024-02-06 DIAGNOSIS — M75102 Unspecified rotator cuff tear or rupture of left shoulder, not specified as traumatic: Secondary | ICD-10-CM | POA: Diagnosis not present

## 2024-02-06 DIAGNOSIS — M25512 Pain in left shoulder: Secondary | ICD-10-CM | POA: Diagnosis not present

## 2024-02-06 DIAGNOSIS — M24012 Loose body in left shoulder: Secondary | ICD-10-CM | POA: Diagnosis not present

## 2024-02-07 DIAGNOSIS — M19012 Primary osteoarthritis, left shoulder: Secondary | ICD-10-CM | POA: Diagnosis not present

## 2024-02-07 DIAGNOSIS — M75112 Incomplete rotator cuff tear or rupture of left shoulder, not specified as traumatic: Secondary | ICD-10-CM | POA: Diagnosis not present

## 2024-02-07 DIAGNOSIS — M7582 Other shoulder lesions, left shoulder: Secondary | ICD-10-CM | POA: Diagnosis not present

## 2024-02-07 DIAGNOSIS — M7522 Bicipital tendinitis, left shoulder: Secondary | ICD-10-CM | POA: Diagnosis not present

## 2024-02-10 ENCOUNTER — Encounter: Payer: Self-pay | Admitting: Internal Medicine

## 2024-02-10 ENCOUNTER — Ambulatory Visit (INDEPENDENT_AMBULATORY_CARE_PROVIDER_SITE_OTHER): Payer: Self-pay | Admitting: Internal Medicine

## 2024-02-10 ENCOUNTER — Other Ambulatory Visit: Payer: Self-pay

## 2024-02-10 VITALS — BP 118/82 | HR 68 | Temp 98.8°F | Ht 70.0 in | Wt 266.5 lb

## 2024-02-10 DIAGNOSIS — T7800XD Anaphylactic reaction due to unspecified food, subsequent encounter: Secondary | ICD-10-CM | POA: Diagnosis not present

## 2024-02-10 DIAGNOSIS — J4530 Mild persistent asthma, uncomplicated: Secondary | ICD-10-CM | POA: Diagnosis not present

## 2024-02-10 DIAGNOSIS — J310 Chronic rhinitis: Secondary | ICD-10-CM | POA: Diagnosis not present

## 2024-02-10 DIAGNOSIS — T7800XA Anaphylactic reaction due to unspecified food, initial encounter: Secondary | ICD-10-CM

## 2024-02-10 DIAGNOSIS — L508 Other urticaria: Secondary | ICD-10-CM | POA: Diagnosis not present

## 2024-02-10 MED ORDER — AIRSUPRA 90-80 MCG/ACT IN AERO: 2.0000 | INHALATION_SPRAY | RESPIRATORY_TRACT | 3 refills | Status: DC | PRN

## 2024-02-10 MED ORDER — EPINEPHRINE 0.3 MG/0.3ML IJ SOAJ: 0.3000 mg | INTRAMUSCULAR | 1 refills | Status: AC | PRN

## 2024-02-10 NOTE — Progress Notes (Signed)
 NEW PATIENT Date of Service/Encounter:   02/10/2024 Referring provider: none-self referred Primary care provider: [facility], MD  Subjective:  William Gentry is a 53 y.o. male with PMHx of CAD, HTN, hypercholesterolemia, ED, nicotine  dysfunction presenting today for evaluation of shellfish allergy, chronic rhinitis, and asthma.  History obtained from: chart review and patient.   Discussed the use of AI scribe software for clinical note transcription with the patient, who gave verbal consent to proceed.  History of Present Illness William Gentry is a 53 year old male who presents with concerns about shellfish allergy, seasonal allergies and asthma.  Shellfish-induced hypersensitivity reactions - Severe allergic reaction to shrimp years ago, with onset of heavy breathing and facial swelling within minutes of ingestion - Required emergency care and received an injection for treatment - Subsequent reaction to shrimp was less severe, presenting as heavy breathing, managed with over-the-counter liquid Benadryl  - No history of Epipen  ownership - No other food or medication allergies - avoids shellfish since last accidental exposure years ago  Asthma symptoms and triggers - History of asthma, primarily managed with albuterol  inhaler - Current use of rescue inhaler once or twice daily, most often triggered by physical exertion at work - No exacerbation of symptoms with upper respiratory infections - Previous use of a discus inhaler (described as 'purple round thing') daily during childhood, but not used in many years - no prior hospitalizations, no exacerbations requiring systemic steroids or UC/ED visits in the past year  Allergic rhinoconjunctivitis - Mild seasonal allergy symptoms during fall and spring transitions, including stuffy nose and itchy eyes - Symptoms managed with over-the-counter medications such as Zyrtec  or aspirin - Symptoms less severe than in  childhood  Tobacco exposure - Occasional cigarette smoking, described as 'on and off' over the years - 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 maximum 12 puffs/day 10.7 g 3   amLODipine  (NORVASC ) 5 MG tablet TAKE 1 TABLET BY MOUTH EVERY DAY 90 tablet 3   EPINEPHrine  (AUVI-Q ) 0.3 mg/0.3 mL IJ SOAJ injection Inject 0.3 mg into the muscle as needed for anaphylaxis. 1 each 1   rosuvastatin  (CRESTOR ) 40 MG tablet TAKE 1 TABLET BY MOUTH EVERY DAY 90 tablet 0   meloxicam  (MOBIC ) 7.5 MG tablet Take 1 tablet (7.5 mg total) by mouth daily as needed for pain. (Patient not taking: Reported on 02/10/2024) 10 tablet 0   nicotine  (NICODERM CQ  - DOSED IN MG/24 HOURS) 14 mg/24hr patch Place 1 patch (14 mg total) onto the skin daily. Call for next step patch. (Patient not taking: Reported on 02/10/2024) 14 patch 0   nicotine  polacrilex (COMMIT) 2 MG lozenge TAKE 1 LOZENGE (2 MG TOTAL) BY MOUTH AS NEEDED FOR SMOKING CESSATION. (Patient not taking: Reported on 02/10/2024) 135 lozenge 0   No current facility-administered medications for this visit.   Known Allergies:  Allergies  Allergen Reactions   Shellfish Allergy Swelling   Past Surgical History: Past Surgical History:  Procedure Laterality Date   CARDIAC SURGERY     COLONOSCOPY WITH PROPOFOL  N/A 09/30/2020   Procedure: COLONOSCOPY WITH PROPOFOL ;  Surgeon: Therisa Bi, MD;  Location: [facility] ENDOSCOPY;  Service: Gastroenterology;  Laterality: N/A;    CORONARY ARTERY BYPASS GRAFT     age 7   Family History: Family History  Problem Relation Age of Onset   Cancer Mother        brain cancer   Lupus Mother    Social History: William Gentry lives in a house built 40 years ago, no water damage, carpet in bedroom, gas heating and heat pump cooling, indoor dog, no roaches, using DM covers on bed not pillows, no smoke exposure in home or car, works at [employer] in maintenance, + HEPA filter in home, [place], smoked 1 ppd 2021-2024, occasionally still socially smokes.   ROS:  All other systems negative except as noted per HPI.  Objective:  Blood pressure 118/82, pulse 68, temperature 98.8 F (37.1 C), temperature source Temporal, height 5' 10 (1.778 m), weight 266 lb 8 oz (120.9 kg), SpO2 96%. Body mass index is 38.24 kg/m. Physical Exam:  General Appearance:  Alert, cooperative, no distress, appears stated age  Head:  Normocephalic, without obvious abnormality, atraumatic  Eyes:  Conjunctiva clear, EOM's intact  Ears EACs normal bilaterally and normal TMs bilaterally  Nose: Nares normal, hypertrophic turbinates and normal mucosa  Throat: Lips, tongue normal; teeth and gums normal, normal posterior oropharynx  Neck: Supple, symmetrical  Lungs:   clear to auscultation bilaterally, Respirations unlabored, no coughing  Heart:  regular rate and rhythm, Appears well perfused  Extremities: No edema  Skin: Skin color, texture, turgor normal and no rashes or lesions on visualized portions of skin  Neurologic: No gross deficits   Diagnostics: Spirometry:  Tracings reviewed. His effort: It was hard to get consistent efforts and there is a question as to whether this reflects a maximal maneuver. FVC: 4.17L  FEV1: 2.65L, 82% predicted  FEV1/FVC ratio: 0.64  Interpretation: borderline obstruction with normal FEV1.  Please see scanned spirometry results for details.   Labs:  Lab Orders  No laboratory  test(s) ordered today     Assessment and Plan  Assessment and Plan Assessment & Plan Shellfish allergy Severe allergic reaction to shrimp with facial swelling and difficulty breathing. Subsequent milder reaction managed with liquid diphenhydramine .  - Prescribe AuviQ for emergency use in case of severe allergic reaction. - Order allergy testing for shrimp to confirm allergy status. Next Monday. - Instruct to avoid shellfish until allergy status is confirmed. - Coordinate with specialty pharmacy [facility] for [facility] prescription verification and cost discussion.  Asthma; mild persistent Asthma with exercise-induced dyspnea. Current use of rescue inhaler once or twice daily indicates suboptimal control. No recent hospitalizations or need for oral steroids. 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 - your lung testing today looked great - Controller Inhaler: none - Rescue Inhaler: Airsupra  2 puffs. Use  every 4-6 hours as needed for chest tightness, wheezing, or coughing.   Can also use 15 minutes prior to exercise if you have symptoms with activity. - Asthma is not controlled if:  - Symptoms are occurring >2 times a week OR  - >2 times a month nighttime awakenings  - You are requiring systemic steroids (prednisone /steroid injections) more than once per year  - Your require hospitalization for your asthma.  - Please call the clinic to schedule a follow up if these symptoms arise  Avoid smoke exposure Stay up-to-date with your annual flu vaccines, COVID vaccines and pneumonia vaccines when indicated.   Seasonal allergic rhinitis Seasonal allergic rhinitis with mild symptoms during spring and fall, including stuffy nose and itchy eyes, managed with over-the-counter medications like cetirizine . Symptoms have improved with age. - return for skin allergy testing to environmental allergies  Follow up : next Monday at 9:30 AM (1-55, shellfish mix and individual), must  stop anithistamines for 3 days prior to visit. It was a pleasure meeting you in clinic today! Thank you for allowing me to participate in your care.    This note in its entirety was forwarded to the Provider who requested this consultation.  Other: samples provided of airsupra   Thank you for your kind referral. I appreciate the opportunity to take part in William Gentry's care. Please do not hesitate to contact me with questions.  Sincerely,  Rocky Endow, MD [facility] 

## 2024-02-10 NOTE — Patient Instructions (Signed)
 Shellfish allergy Severe allergic reaction to shrimp with facial swelling and difficulty breathing. Subsequent milder reaction managed with liquid diphenhydramine .  - Prescribe AuviQ for emergency use in case of severe allergic reaction. - Order allergy testing for shrimp to confirm allergy status. Next Monday. - Instruct to avoid shellfish until allergy status is confirmed. - Coordinate with specialty pharmacy [facility] for [facility] prescription verification and cost discussion.  Asthma Asthma with exercise-induced dyspnea. Current use of rescue inhaler once or twice daily indicates suboptimal control. No recent hospitalizations or need for oral steroids. 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 - your lung testing today looked great - Controller Inhaler: none - Rescue Inhaler: Airsupra  2 puffs. Use  every 4-6 hours as needed for chest tightness, wheezing, or coughing.   Can also use 15 minutes prior to exercise if you have symptoms with activity. - Asthma is not controlled if:  - Symptoms are occurring >2 times a week OR  - >2 times a month nighttime awakenings  - You are requiring systemic steroids (prednisone /steroid injections) more than once per year  - Your require hospitalization for your asthma.  - Please call the clinic to schedule a follow up if these symptoms arise Avoid smoke exposure Stay up-to-date with your annual flu vaccines, COVID vaccines and pneumonia vaccines when indicated.   Seasonal allergic rhinitis Seasonal allergic rhinitis with mild symptoms during spring and fall, including stuffy nose and itchy eyes, managed with over-the-counter medications like cetirizine . Symptoms have improved with age. - return for skin allergy testing to environmental allergies  Follow up : next Monday at 9:30 AM (1-55, shellfish mix and individual), must stop anithistamines for 3 days prior to visit. It was a pleasure meeting you in clinic today! Thank you for  allowing me to participate in your care.

## 2024-02-17 ENCOUNTER — Encounter: Payer: Self-pay | Admitting: Internal Medicine

## 2024-02-17 ENCOUNTER — Ambulatory Visit (INDEPENDENT_AMBULATORY_CARE_PROVIDER_SITE_OTHER): Admitting: Internal Medicine

## 2024-02-17 DIAGNOSIS — T7800XD Anaphylactic reaction due to unspecified food, subsequent encounter: Secondary | ICD-10-CM | POA: Diagnosis not present

## 2024-02-17 DIAGNOSIS — J3089 Other allergic rhinitis: Secondary | ICD-10-CM | POA: Diagnosis not present

## 2024-02-17 DIAGNOSIS — T7800XA Anaphylactic reaction due to unspecified food, initial encounter: Secondary | ICD-10-CM

## 2024-02-17 DIAGNOSIS — L508 Other urticaria: Secondary | ICD-10-CM

## 2024-02-17 DIAGNOSIS — J302 Other seasonal allergic rhinitis: Secondary | ICD-10-CM

## 2024-02-17 NOTE — Patient Instructions (Addendum)
 Shellfish allergy  Severe allergic reaction to shrimp with facial swelling and difficulty breathing. Subsequent milder reaction managed with liquid diphenhydramine .  - Prescribe AuviQ for emergency use in case of severe allergic reaction. - skin testing to shellfish positive to crab and scallos - strict avoidance of all shellfish - AuviQ sent at previous visit, carry at all times in case of accidental exposure to shellfish and reaction  Asthma Asthma with exercise-induced dyspnea. Current use of rescue inhaler once or twice daily indicates suboptimal control. No recent hospitalizations or need for oral steroids. 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 - your lung testing today looked great - Controller Inhaler: none - Rescue Inhaler: Airsupra  2 puffs. Use  every 4-6 hours as needed for chest tightness, wheezing, or coughing.   Can also use 15 minutes prior to exercise if you have symptoms with activity. - Asthma is not controlled if:  - Symptoms are occurring >2 times a week OR  - >2 times a month nighttime awakenings  - You are requiring systemic steroids (prednisone /steroid injections) more than once per year  - Your require hospitalization for your asthma.  - Please call the clinic to schedule a follow up if these symptoms arise Avoid smoke exposure Stay up-to-date with your annual flu vaccines, COVID vaccines and pneumonia vaccines when indicated.   Seasonal and perennial allergic rhinitis Seasonal allergic rhinitis with mild symptoms during spring and fall, including stuffy nose and itchy eyes, managed with over-the-counter medications like cetirizine . Symptoms have improved with age. - environmental skin testing 02/17/24: positive to grass pollen, tree pollen, dust mites and cat; ID testing positive to additional grass pollen, weed pollen, dog and cockroach; allergen avoidance -Consider allergy  injections to reduce lifetime symptoms and need for  medications by teaching your immune system to become tolerant of the environmental allergens you are allergic to   Follow up : 3 months, sooner if needed It was a pleasure seeing you again in clinic today! Thank you for allowing me to participate in your care.  Reducing Pollen Exposure  The American Academy of Allergy , Asthma and Immunology suggests the following steps to reduce your exposure to pollen during allergy  seasons.    Do not hang sheets or clothing out to dry; pollen may collect on these items. Do not mow lawns or spend time around freshly cut grass; mowing stirs up pollen. Keep windows closed at night.  Keep car windows closed while driving. Minimize morning activities outdoors, a time when pollen counts are usually at their highest. Stay indoors as much as possible when pollen counts or humidity is high and on windy days when pollen tends to remain in the air longer. Use air conditioning when possible.  Many air conditioners have filters that trap the pollen spores. Use a HEPA room air filter to remove pollen form the indoor air you breathe. DUST MITE AVOIDANCE MEASURES:  There are three main measures that need and can be taken to avoid house dust mites:  Reduce accumulation of dust in general -reduce furniture, clothing, carpeting, books, stuffed animals, especially in bedroom  Separate yourself from the dust -use pillow and mattress encasements (can be found at stores such as [employer] or online) -avoid direct exposure to air condition flow -use a HEPA filter device, especially in the bedroom; you can also use a HEPA filter vacuum cleaner -wipe dust with a moist towel instead of a dry towel or broom when cleaning  Decrease mites and/or their secretions -wash clothing  and linen and stuffed animals at highest temperature possible, at least every 2 weeks -stuffed animals can also be placed in a bag and put in a freezer overnight  Despite the above measures,  it is impossible to eliminate dust mites or their allergen completely from your home.  With the above measures the burden of mites in your home can be diminished, with the goal of minimizing your allergic symptoms.  Success will be reached only when implementing and using all means together. Control of Dog or Cat Allergen  Avoidance is the best way to manage a dog or cat allergy . If you have a dog or cat and are allergic to dog or cats, consider removing the dog or cat from the home. If you have a dog or cat but don't want to find it a new home, or if your family wants a pet even though someone in the household is allergic, here are some strategies that may help keep symptoms at bay:  Keep the pet out of your bedroom and restrict it to only a few rooms. Be advised that keeping the dog or cat in only one room will not limit the allergens to that room. Don't pet, hug or kiss the dog or cat; if you do, wash your hands with soap and water. High-efficiency particulate air (HEPA) cleaners run continuously in a bedroom or living room can reduce allergen levels over time. Regular use of a high-efficiency vacuum cleaner or a central vacuum can reduce allergen levels. Giving your dog or cat a bath at least once a week can reduce airborne allergen. Control of Cockroach Allergen  Cockroach allergen has been identified as an important cause of acute attacks of asthma, especially in urban settings.  There are fifty-five species of cockroach that exist in the [place] , however only three, the [place], [place] and [place] species produce allergen that can affect patients with Asthma.  Allergens can be obtained from fecal particles, egg casings and secretions from cockroaches.    Remove food sources. Reduce access to water. Seal access and entry points. Spray runways with 0.5-1% Diazinon or Chlorpyrifos Blow boric acid power under stoves and refrigerator. Place bait stations (hydramethylnon) at feeding  sites.

## 2024-02-17 NOTE — Progress Notes (Signed)
 Date of Service/Encounter:  02/17/24  Allergy  testing appointment   Initial visit on 02/10/24, seen for shellfish allergy , asthma, chronic rhinitis.  Please see that note for additional details.  Today reports for allergy  diagnostic testing:    DIAGNOSTICS:  Skin Testing: Environmental allergy  panel and select foods. Adequate positive and negative controls. Results discussed with patient/family.  Airborne Adult Perc - 02/17/24 0957     Time Antigen Placed 0957    Allergen Manufacturer Jestine    Location Back    Number of Test 55    1. Control-Buffer 50% Glycerol Negative    2. Control-Histamine 3+    3. Bahia Negative    4. French Southern Territories Negative    5. Johnson Negative    6. [place]  Blue 3+    7. Meadow Fescue Negative    8. Perennial Rye 3+    9. Timothy 2+    10. Ragweed Mix Negative    11. Cocklebur Negative    12. Plantain,  English Negative    13. Baccharis Negative    14. Dog Fennel Negative    15. Russian Thistle Negative    16. Lamb's Quarters Negative    17. Sheep Sorrell Negative    18. Rough Pigweed Negative    19. Marsh Elder, Rough Negative    20. Mugwort, Common Negative    21. Box, Elder Negative    22. Cedar, red Negative    23. Sweet Gum Negative    24. Pecan Pollen 3+    25. Pine Mix Negative    26. Walnut, Black Pollen 3+    27. Red Mulberry Negative    28. Ash Mix Negative    29. Birch Mix Negative    30. Beech American Negative    31. Cottonwood, Guinea-Bissau Negative    32. Hickory, White 3+    33. Maple Mix Negative    34. Oak, Guinea-Bissau Mix Negative    35. Sycamore Eastern Negative    36. Alternaria Alternata Negative    37. Cladosporium Herbarum Negative    38. Aspergillus Mix Negative    39. Penicillium Mix Negative    40. Bipolaris Sorokiniana (Helminthosporium) Negative    41. Drechslera Spicifera (Curvularia) Negative    42. Mucor Plumbeus Negative    43. Fusarium Moniliforme Negative    44. Aureobasidium Pullulans (pullulara) Negative     45. Rhizopus Oryzae Negative    46. Botrytis Cinera Negative    47. Epicoccum Nigrum Negative    48. Phoma Betae Negative    49. Dust Mite Mix 3+    50. Cat Hair 10,000 BAU/ml 3+    51.  Dog Epithelia Negative    52. Mixed Feathers Negative    53. Horse Epithelia Negative    54. Cockroach, German Negative    55. Tobacco Leaf Negative          Intradermal - 02/17/24 1011     Time Antigen Placed 1011    Allergen Manufacturer Greer    Location Arm    Number of Test 12    Control Negative    Bahia 3+    French Southern Territories 3+    Johnson 2+    Ragweed Mix 3+    Weed Mix 3+    Mold 1 Negative    Mold 2 Negative    Mold 3 Negative    Mold 4 Negative    Dog 3+    Cockroach 4+          Food Adult Perc -  02/17/24 0900     Time Antigen Placed 9042    Allergen Manufacturer Jestine    Location Back    Number of allergen test 6    8. Shellfish Mix --   15x24   23. Shrimp Negative    24. Crab --   10x30   25. Lobster Negative    26. Oyster Negative    27. Scallops --   10x15         Allergy  testing results were read and interpreted by myself, documented by clinical staff.  Patient provided with copy of allergy  testing along with avoidance measures when indicated.   Rocky Endow, MD  [facility]   -------------------------------------------------------------- Shellfish allergy  Severe allergic reaction to shrimp with facial swelling and difficulty breathing. Subsequent milder reaction managed with liquid diphenhydramine .  - Prescribe AuviQ for emergency use in case of severe allergic reaction. - skin testing to shellfish positive to crab and scallos - strict avoidance of all shellfish - AuviQ sent at previous visit, carry at all times in case of accidental exposure to shellfish and reaction  Asthma Asthma with exercise-induced dyspnea. Current use of rescue inhaler once or twice daily indicates suboptimal control. No recent hospitalizations or  need for oral steroids. 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 - your lung testing today looked great - Controller Inhaler: none - Rescue Inhaler: Airsupra  2 puffs. Use  every 4-6 hours as needed for chest tightness, wheezing, or coughing.   Can also use 15 minutes prior to exercise if you have symptoms with activity. - Asthma is not controlled if:  - Symptoms are occurring >2 times a week OR  - >2 times a month nighttime awakenings  - You are requiring systemic steroids (prednisone /steroid injections) more than once per year  - Your require hospitalization for your asthma.  - Please call the clinic to schedule a follow up if these symptoms arise Avoid smoke exposure Stay up-to-date with your annual flu vaccines, COVID vaccines and pneumonia vaccines when indicated.   Seasonal and perennial allergic rhinitis Seasonal allergic rhinitis with mild symptoms during spring and fall, including stuffy nose and itchy eyes, managed with over-the-counter medications like cetirizine . Symptoms have improved with age. - environmental skin testing 02/17/24: positive to grass pollen, tree pollen, dust mites and cat; ID testing positive to additional grass pollen, weed pollen, dog and cockroach; allergen avoidance -Consider allergy  injections to reduce lifetime symptoms and need for medications by teaching your immune system to become tolerant of the environmental allergens you are allergic to   Follow up : 3 months, sooner if needed It was a pleasure seeing you again in clinic today! Thank you for allowing me to participate in your care.

## 2024-03-12 ENCOUNTER — Encounter: Payer: Self-pay | Admitting: Nurse Practitioner

## 2024-03-12 ENCOUNTER — Ambulatory Visit: Admitting: Nurse Practitioner

## 2024-03-12 VITALS — BP 134/82 | HR 95 | Temp 97.9°F | Ht 70.0 in | Wt 242.2 lb

## 2024-03-12 DIAGNOSIS — R7303 Prediabetes: Secondary | ICD-10-CM

## 2024-03-12 DIAGNOSIS — E7800 Pure hypercholesterolemia, unspecified: Secondary | ICD-10-CM | POA: Diagnosis not present

## 2024-03-12 DIAGNOSIS — J4530 Mild persistent asthma, uncomplicated: Secondary | ICD-10-CM

## 2024-03-12 DIAGNOSIS — I10 Essential (primary) hypertension: Secondary | ICD-10-CM | POA: Diagnosis not present

## 2024-03-12 MED ORDER — ROSUVASTATIN CALCIUM 40 MG PO TABS: 40.0000 mg | ORAL_TABLET | Freq: Every day | ORAL | 1 refills | Status: AC

## 2024-03-12 MED ORDER — AMLODIPINE BESYLATE 5 MG PO TABS: 5.0000 mg | ORAL_TABLET | Freq: Every day | ORAL | 1 refills | Status: AC

## 2024-03-12 NOTE — Progress Notes (Signed)
 Established Patient Office Visit  Subjective:  Patient ID: William Gentry, male    DOB: August 11, 1970  Age: 54 y.o. MRN: [id number]  CC:  Chief Complaint  Patient presents with   Establish Care    Transfer of Care   Discussed the use of a AI scribe software for clinical note transcription with the patient, who gave verbal consent to proceed.  HPI  William Gentry presents for transition of care.  His previous provider was Dr. Maribeth.  He has hypertension and has not taken amlodipine  recently due to running out of medication and a prescription mix-up. Home blood pressure readings are around 120-130/80-82 mmHg.  He is prediabetic and has not had blood work since 2024. He plans to return for fasting blood work.  He has asthma and uses an albuterol  inhaler as a rescue medication. He was prescribed Airsupra  but initially did not purchase it due to cost. He has since acquired it but is unsure if the prescription is still active.  He has a shellfish allergy  and carries an Epipen .  He quit smoking in 2010 and stopped using chewing tobacco two years ago. He does not consume alcohol or use recreational drugs. He works at [employer] and is married with six children, including a museum/gallery conservator.  No current pain, stomach issues, or constipation.   HPI   Past Medical History:  Diagnosis Date   Alcoholism in recovery [facility])    recovery since 2011   Allergy     Asthma    Hypertension    Wolff-Parkinson-White (WPW) syndrome     Past Surgical History:  Procedure Laterality Date   CARDIAC SURGERY     COLONOSCOPY WITH PROPOFOL  N/A 09/30/2020   Procedure: COLONOSCOPY WITH PROPOFOL ;  Surgeon: Therisa Bi, MD;  Location: [facility] ENDOSCOPY;  Service: Gastroenterology;  Laterality: N/A;   CORONARY ARTERY BYPASS GRAFT     age 34    Family History  Problem Relation Age of Onset   Cancer Mother        brain cancer   Lupus Mother    Hypertension Father    Colon cancer Neg Hx    Prostate cancer  Neg Hx     Social History   Socioeconomic History   Marital status: Married    Spouse name: Lucienne   Number of children: 6   Years of education: Not on file   Highest education level: Not on file  Occupational History   Not on file  Tobacco Use   Smoking status: Former    Current packs/day: 0.00    Types: Cigarettes    Quit date: 2010    Years since quitting: 15.8   Smokeless tobacco: Former    Types: Chew    Quit date: 2023  Vaping Use   Vaping status: Never Used  Substance and Sexual Activity   Alcohol use: No    Comment: former alcoholic, clean since 2015   Drug use: No   Sexual activity: Yes  Other Topics Concern   Not on file  Social History Narrative   Marital status: married   Children: 5 girls 1 boy; 2 grandchildren   Lives: with wife, 2 youngest (9,7)   Employment: [employer]   Tobacco: quit smoking; smoked x 7 years.   Alcohol: quit drinking 4 years ago; heavy.   Drugs: none   Exercise:  trying   Social Drivers of Health   Financial Resource Strain: Low Risk  (03/19/2024)   Received from [facility]  Overall Financial Resource Strain (CARDIA)    Difficulty of Paying Living Expenses: Not very hard  Food Insecurity: No Food Insecurity (03/19/2024)   Received from [facility]   Hunger Vital Sign    Within the past 12 months, you worried that your food would run out before you got the money to buy more.: Never true    Within the past 12 months, the food you bought just didn't last and you didn't have money to get more.: Never true  Transportation Needs: No Transportation Needs (03/19/2024)   Received from [facility] - Transportation    In the past 12 months, has lack of transportation kept you from medical appointments or from getting medications?: No    Lack of Transportation (Non-Medical): No  Physical Activity: Not on file  Stress: Not on file  Social Connections: Not on file   Intimate Partner Violence: Not on file     Outpatient Medications Prior to Visit  Medication Sig Dispense Refill   Albuterol -Budesonide  (AIRSUPRA ) 90-80 MCG/ACT AERO Inhale 2 puffs into the lungs as needed. maximum 12 puffs/day 10.7 g 3   EPINEPHrine  (AUVI-Q ) 0.3 mg/0.3 mL IJ SOAJ injection Inject 0.3 mg into the muscle as needed for anaphylaxis. 1 each 1   meloxicam  (MOBIC ) 7.5 MG tablet Take 1 tablet (7.5 mg total) by mouth daily as needed for pain. (Patient not taking: Reported on 03/12/2024) 10 tablet 0   nicotine  (NICODERM CQ  - DOSED IN MG/24 HOURS) 14 mg/24hr patch Place 1 patch (14 mg total) onto the skin daily. Call for next step patch. (Patient not taking: Reported on 03/12/2024) 14 patch 0   nicotine  polacrilex (COMMIT) 2 MG lozenge TAKE 1 LOZENGE (2 MG TOTAL) BY MOUTH AS NEEDED FOR SMOKING CESSATION. (Patient not taking: Reported on 03/12/2024) 135 lozenge 0   amLODipine  (NORVASC ) 5 MG tablet TAKE 1 TABLET BY MOUTH EVERY DAY 90 tablet 3   rosuvastatin  (CRESTOR ) 40 MG tablet TAKE 1 TABLET BY MOUTH EVERY DAY 90 tablet 0   No facility-administered medications prior to visit.    Allergies  Allergen Reactions   Shellfish Allergy  Swelling    ROS Review of Systems Negative unless indicated in HPI.    Objective:    Physical Exam Constitutional:      Appearance: Normal appearance.  HENT:     Mouth/Throat:     Mouth: Mucous membranes are moist.  Eyes:     Conjunctiva/sclera: Conjunctivae normal.     Pupils: Pupils are equal, round, and reactive to light.  Cardiovascular:     Rate and Rhythm: Normal rate and regular rhythm.     Pulses: Normal pulses.     Heart sounds: Normal heart sounds.  Pulmonary:     Effort: Pulmonary effort is normal.     Breath sounds: Normal breath sounds.  Abdominal:     General: Bowel sounds are normal.     Palpations: Abdomen is soft.  Musculoskeletal:     Cervical back: Normal range of motion. No tenderness.  Skin:    General: Skin is  warm.     Findings: No bruising.  Neurological:     General: No focal deficit present.     Mental Status: He is alert and oriented to person, place, and time. Mental status is at baseline.  Psychiatric:        Mood and Affect: Mood normal.        Behavior: Behavior normal.  Thought Content: Thought content normal.        Judgment: Judgment normal.     BP 134/82   Pulse 95   Temp 97.9 F (36.6 C)   Ht 5' 10 (1.778 m)   Wt 242 lb 3.2 oz (109.9 kg)   SpO2 96%   BMI 34.75 kg/m  Wt Readings from Last 3 Encounters:  03/23/24 264 lb (119.7 kg)  03/12/24 242 lb 3.2 oz (109.9 kg)  02/10/24 266 lb 8 oz (120.9 kg)     Health Maintenance  Topic Date Due   Pneumococcal Vaccine: 50+ Years (1 of 2 - PCV) Never done   Hepatitis B Vaccines 19-59 Average Risk (1 of 3 - 19+ 3-dose series) Never done   Zoster Vaccines- Shingrix (1 of 2) Never done   COVID-19 Vaccine (4 - 2025-26 season) 03/27/2024 (Originally 01/27/2024)   Influenza Vaccine  08/25/2024 (Originally 12/27/2023)   DTaP/Tdap/Td (2 - Td or Tdap) 03/21/2026   Colonoscopy  10/01/2030   Hepatitis C Screening  Completed   HIV Screening  Completed   HPV VACCINES  Aged Out   Meningococcal B Vaccine  Aged Out       Topic Date Due   Hepatitis B Vaccines 19-59 Average Risk (1 of 3 - 19+ 3-dose series) Never done    Lab Results  Component Value Date   TSH 0.81 01/30/2018   Lab Results  Component Value Date   WBC 6.4 01/30/2018   HGB 13.8 01/30/2018   HCT 41.3 01/30/2018   MCV 86.1 01/30/2018   PLT 267.0 01/30/2018   Lab Results  Component Value Date   NA 141 11/19/2022   K 4.3 11/19/2022   CO2 27 11/19/2022   GLUCOSE 84 11/19/2022   BUN 14 11/19/2022   CREATININE 0.97 11/19/2022   BILITOT 0.3 01/17/2022   ALKPHOS 38 (L) 01/17/2022   AST 16 01/17/2022   ALT 15 01/17/2022   PROT 7.3 01/17/2022   ALBUMIN 4.3 01/17/2022   CALCIUM  9.7 11/19/2022   GFR 89.08 11/19/2022   Lab Results  Component Value Date    CHOL 177 01/17/2022   Lab Results  Component Value Date   HDL 36.00 (L) 01/17/2022   Lab Results  Component Value Date   LDLCALC 113 (H) 01/17/2022   Lab Results  Component Value Date   TRIG 138.0 01/17/2022   Lab Results  Component Value Date   CHOLHDL 5 01/17/2022   Lab Results  Component Value Date   HGBA1C 6.1 11/19/2022      Assessment & Plan:  Prediabetes Assessment & Plan: We will check A1c.  Orders: -     Hemoglobin A1c; Future  Pure hypercholesterolemia Assessment & Plan: Check lipid panel.   -Continue Crestor  40 mg daily.  Orders: -     Rosuvastatin  Calcium ; Take 1 tablet (40 mg total) by mouth daily.  Dispense: 90 tablet; Refill: 1 -     Lipid panel; Future  Essential hypertension Assessment & Plan: Patient BP  Vitals:   03/12/24 1502  BP: 134/82    in the office. -Advised pt to follow a low sodium and heart healthy diet. - Refill amlodipine  prescription. - Encourage resumption of amlodipine .    Orders: -     amLODIPine  Besylate; Take 1 tablet (5 mg total) by mouth daily.  Dispense: 90 tablet; Refill: 1 -     CBC with Differential/Platelet; Future -     Comprehensive metabolic panel with GFR; Future  Mild persistent asthma without complication  Assessment & Plan: Chronic issue.   -Continue Airsupra      Follow-up: Return in about 3 months (around 06/12/2024) for physical, fasting lab next week.   Charlen Bakula, NP

## 2024-03-19 ENCOUNTER — Other Ambulatory Visit

## 2024-03-19 ENCOUNTER — Other Ambulatory Visit: Payer: Self-pay | Admitting: Orthopedic Surgery

## 2024-03-19 DIAGNOSIS — S46011A Strain of muscle(s) and tendon(s) of the rotator cuff of right shoulder, initial encounter: Secondary | ICD-10-CM | POA: Diagnosis not present

## 2024-03-23 ENCOUNTER — Other Ambulatory Visit: Payer: Self-pay

## 2024-03-23 ENCOUNTER — Encounter
Admission: RE | Admit: 2024-03-23 | Discharge: 2024-03-23 | Disposition: A | Discharge status: Home / Self Care | Source: Ambulatory Visit | Attending: Orthopedic Surgery | Admitting: Orthopedic Surgery

## 2024-03-23 VITALS — Ht 72.0 in | Wt 264.0 lb

## 2024-03-23 DIAGNOSIS — Z01810 Encounter for preprocedural cardiovascular examination: Secondary | ICD-10-CM

## 2024-03-23 DIAGNOSIS — I10 Essential (primary) hypertension: Secondary | ICD-10-CM

## 2024-03-23 NOTE — Patient Instructions (Addendum)
 Your procedure is scheduled on:  TUESDAY  OCTOBER 28  Report to the Registration Desk on the 1st floor of the [facility] To find out your arrival time, please call [phone] between 1PM - 3PM on:  MONDAY  OCTOBER 27  If your arrival time is 6:00 am, do not arrive before that time as the [facility] entrance doors do not open until 6:00 am.  REMEMBER: Instructions that are not followed completely may result in serious medical risk, up to and including death; or upon the discretion of your surgeon and anesthesiologist your surgery may need to be rescheduled.  Do not eat food after midnight the night before surgery.  No gum chewing or hard candies.  You may however, drink CLEAR liquids up to 2 hours before you are scheduled to arrive for your surgery. Do not drink anything within 2 hours of your scheduled arrival time.  Clear liquids include: - water  - apple juice without pulp - gatorade (not RED colors) - black coffee or tea (Do NOT add milk or creamers to the coffee or tea) Do NOT drink anything that is not on this list.   One week prior to surgery: Stop Anti-inflammatories (NSAIDS) such as Advil, Aleve, Ibuprofen, Motrin, Naproxen, Naprosyn and Aspirin based products such as Excedrin, Goody's Powder, BC Powder. Stop ANY OVER THE COUNTER supplements until after surgery.  You may however, continue to take Tylenol  if needed for pain up until the day of surgery.  Continue taking all of your other prescription medications up until the day of surgery.  ON THE DAY OF SURGERY ONLY TAKE THESE MEDICATIONS WITH SIPS OF WATER:  amLODipine  (NORVASC )   No nicotine  patches on the day of surgery.  Do not use any recreational drugs for at least a week (preferably 2 weeks) before your surgery.  Please be advised that the combination of cocaine and anesthesia may have negative outcomes, up to and including death. If you test positive for cocaine, your surgery will be cancelled.  On  the morning of surgery brush your teeth with toothpaste and water, you may rinse your mouth with mouthwash if you wish. Do not swallow any toothpaste or mouthwash.  Use CHG Soap as directed on instruction sheet.  Do not wear jewelry, make-up, hairpins, clips or nail polish.  For welded (permanent) jewelry: bracelets, anklets, waist bands, etc.  Please have this removed prior to surgery.  If it is not removed, there is a chance that hospital personnel will need to cut it off on the day of surgery.  Do not wear lotions, powders, or perfumes.   Do not shave body hair from the neck down 48 hours before surgery.  Do not bring valuables to the hospital. [facility] is not responsible for any missing/lost belongings or valuables.   Notify your doctor if there is any change in your medical condition (cold, fever, infection).  Wear comfortable clothing (specific to your surgery type) to the hospital.  After surgery, you can help prevent lung complications by doing breathing exercises.  Take deep breaths and cough every 1-2 hours.  If you are being discharged the day of surgery, you will not be allowed to drive home. You will need a responsible individual to drive you home and stay with you for 24 hours after surgery.   If you are taking public transportation, you will need to have a responsible individual with you.  Please call the Pre-admissions Testing Dept. at [phone] if you have any  questions about these instructions.  Surgery Visitation Policy:  Patients having surgery or a procedure may have two visitors.  Children under the age of 30 must have an adult with them who is not the patient.  Merchandiser, Retail to address health-related social needs:  [email or web address]

## 2024-03-23 NOTE — Pre-Procedure Instructions (Signed)
 Patient did not come for office appt. Called left VM to return call for pre op for surgery tomorrow.

## 2024-03-24 ENCOUNTER — Encounter: Payer: Self-pay | Admitting: Orthopedic Surgery

## 2024-03-24 ENCOUNTER — Encounter
Admission: RE | Disposition: A | Discharge status: Home / Self Care | Payer: Self-pay | Source: Home / Self Care | Attending: Orthopedic Surgery

## 2024-03-24 ENCOUNTER — Ambulatory Visit: Admitting: Anesthesiology

## 2024-03-24 ENCOUNTER — Ambulatory Visit: Payer: Self-pay | Admitting: Urgent Care

## 2024-03-24 ENCOUNTER — Ambulatory Visit
Admission: RE | Admit: 2024-03-24 | Discharge: 2024-03-24 | Disposition: A | Discharge status: Home / Self Care | Attending: Orthopedic Surgery | Admitting: Orthopedic Surgery

## 2024-03-24 ENCOUNTER — Ambulatory Visit

## 2024-03-24 ENCOUNTER — Other Ambulatory Visit: Payer: Self-pay

## 2024-03-24 DIAGNOSIS — M24112 Other articular cartilage disorders, left shoulder: Secondary | ICD-10-CM | POA: Diagnosis not present

## 2024-03-24 DIAGNOSIS — M25812 Other specified joint disorders, left shoulder: Secondary | ICD-10-CM | POA: Insufficient documentation

## 2024-03-24 DIAGNOSIS — I456 Pre-excitation syndrome: Secondary | ICD-10-CM | POA: Insufficient documentation

## 2024-03-24 DIAGNOSIS — M7522 Bicipital tendinitis, left shoulder: Secondary | ICD-10-CM | POA: Diagnosis not present

## 2024-03-24 DIAGNOSIS — M19012 Primary osteoarthritis, left shoulder: Secondary | ICD-10-CM | POA: Diagnosis not present

## 2024-03-24 DIAGNOSIS — I10 Essential (primary) hypertension: Secondary | ICD-10-CM | POA: Diagnosis not present

## 2024-03-24 DIAGNOSIS — M7582 Other shoulder lesions, left shoulder: Secondary | ICD-10-CM | POA: Diagnosis not present

## 2024-03-24 DIAGNOSIS — S46012A Strain of muscle(s) and tendon(s) of the rotator cuff of left shoulder, initial encounter: Secondary | ICD-10-CM | POA: Diagnosis not present

## 2024-03-24 DIAGNOSIS — J45909 Unspecified asthma, uncomplicated: Secondary | ICD-10-CM | POA: Diagnosis not present

## 2024-03-24 DIAGNOSIS — Z01810 Encounter for preprocedural cardiovascular examination: Secondary | ICD-10-CM

## 2024-03-24 DIAGNOSIS — S46011A Strain of muscle(s) and tendon(s) of the rotator cuff of right shoulder, initial encounter: Secondary | ICD-10-CM | POA: Insufficient documentation

## 2024-03-24 DIAGNOSIS — M7542 Impingement syndrome of left shoulder: Secondary | ICD-10-CM | POA: Diagnosis not present

## 2024-03-24 DIAGNOSIS — G8918 Other acute postprocedural pain: Secondary | ICD-10-CM | POA: Diagnosis not present

## 2024-03-24 DIAGNOSIS — Z87891 Personal history of nicotine dependence: Secondary | ICD-10-CM | POA: Diagnosis not present

## 2024-03-24 DIAGNOSIS — G8929 Other chronic pain: Secondary | ICD-10-CM | POA: Diagnosis not present

## 2024-03-24 HISTORY — PX: SHOULDER ARTHROSCOPY WITH OPEN ROTATOR CUFF REPAIR: SHX6092

## 2024-03-24 HISTORY — PX: SUBACROMIAL DECOMPRESSION: SHX5174

## 2024-03-24 HISTORY — PX: BICEPT TENODESIS: SHX5116

## 2024-03-24 SURGERY — ARTHROSCOPY, SHOULDER WITH REPAIR, ROTATOR CUFF, OPEN
Anesthesia: General | Site: Shoulder | Laterality: Left

## 2024-03-24 MED ORDER — FENTANYL CITRATE (PF) 100 MCG/2ML IJ SOLN
INTRAMUSCULAR | Status: DC | PRN
  Administered 2024-03-24: 100 ug via INTRAVENOUS

## 2024-03-24 MED ORDER — SUGAMMADEX SODIUM 200 MG/2ML IV SOLN
INTRAVENOUS | Status: DC | PRN
  Administered 2024-03-24: 200 mg via INTRAVENOUS

## 2024-03-24 MED ORDER — ALBUTEROL SULFATE HFA 108 (90 BASE) MCG/ACT IN AERS
INHALATION_SPRAY | RESPIRATORY_TRACT | Status: DC | PRN
Start: 2024-03-24 — End: 2024-03-24
  Administered 2024-03-24: 6 via RESPIRATORY_TRACT

## 2024-03-24 MED ORDER — MIDAZOLAM HCL (PF) 2 MG/2ML IJ SOLN
INTRAMUSCULAR | Status: DC | PRN
  Administered 2024-03-24: 2 mg via INTRAVENOUS

## 2024-03-24 MED ORDER — ACETAMINOPHEN 10 MG/ML IV SOLN
INTRAVENOUS | Status: DC | PRN
  Administered 2024-03-24: 1000 mg via INTRAVENOUS

## 2024-03-24 MED ORDER — BUPIVACAINE LIPOSOME 1.3 % IJ SUSP
INTRAMUSCULAR | Status: DC | PRN
  Administered 2024-03-24: 10 mL via PERINEURAL

## 2024-03-24 MED ORDER — LACTATED RINGERS IR SOLN
Status: DC | PRN
  Administered 2024-03-24 (×8): 3000 mL

## 2024-03-24 MED ORDER — BUPIVACAINE HCL (PF) 0.5 % IJ SOLN
INTRAMUSCULAR | Status: AC
Start: 2024-03-24 — End: 2024-03-24
  Filled 2024-03-24: qty 10

## 2024-03-24 MED ORDER — ASPIRIN 325 MG PO TBEC: 325.0000 mg | DELAYED_RELEASE_TABLET | Freq: Every day | ORAL | 0 refills | Status: AC

## 2024-03-24 MED ORDER — CEFAZOLIN SODIUM-DEXTROSE 2-4 GM/100ML-% IV SOLN
INTRAVENOUS | Status: AC
  Filled 2024-03-24: qty 100

## 2024-03-24 MED ORDER — MIDAZOLAM HCL 2 MG/2ML IJ SOLN
INTRAMUSCULAR | Status: AC
  Filled 2024-03-24: qty 2

## 2024-03-24 MED ORDER — FENTANYL CITRATE (PF) 100 MCG/2ML IJ SOLN: 25.0000 ug | INTRAMUSCULAR | Status: DC | PRN

## 2024-03-24 MED ORDER — ACETAMINOPHEN 10 MG/ML IV SOLN
INTRAVENOUS | Status: AC
  Filled 2024-03-24: qty 100

## 2024-03-24 MED ORDER — MIDAZOLAM HCL (PF) 2 MG/2ML IJ SOLN
2.0000 mg | Freq: Once | INTRAMUSCULAR | Status: AC
  Administered 2024-03-24: 2 mg via INTRAVENOUS

## 2024-03-24 MED ORDER — DEXMEDETOMIDINE HCL IN NACL 80 MCG/20ML IV SOLN
INTRAVENOUS | Status: DC | PRN
  Administered 2024-03-24 (×3): 8 ug via INTRAVENOUS
  Administered 2024-03-24: 12 ug via INTRAVENOUS

## 2024-03-24 MED ORDER — BUPIVACAINE HCL (PF) 0.5 % IJ SOLN
INTRAMUSCULAR | Status: DC | PRN
Start: 2024-03-24 — End: 2024-03-24
  Administered 2024-03-24: 10 mL via PERINEURAL

## 2024-03-24 MED ORDER — ROCURONIUM BROMIDE 100 MG/10ML IV SOLN
INTRAVENOUS | Status: DC | PRN
  Administered 2024-03-24: 50 mg via INTRAVENOUS

## 2024-03-24 MED ORDER — ORAL CARE MOUTH RINSE: 15.0000 mL | Freq: Once | OROMUCOSAL | Status: AC

## 2024-03-24 MED ORDER — FENTANYL CITRATE (PF) 100 MCG/2ML IJ SOLN
INTRAMUSCULAR | Status: AC
  Filled 2024-03-24: qty 2

## 2024-03-24 MED ORDER — OXYCODONE HCL 5 MG PO TABS: 5.0000 mg | ORAL_TABLET | Freq: Once | ORAL | Status: DC | PRN

## 2024-03-24 MED ORDER — FENTANYL CITRATE (PF) 50 MCG/ML IJ SOSY
PREFILLED_SYRINGE | INTRAMUSCULAR | Status: AC
  Filled 2024-03-24: qty 1

## 2024-03-24 MED ORDER — ONDANSETRON HCL 4 MG/2ML IJ SOLN
INTRAMUSCULAR | Status: DC | PRN
  Administered 2024-03-24: 4 mg via INTRAVENOUS

## 2024-03-24 MED ORDER — PROPOFOL 10 MG/ML IV BOLUS
INTRAVENOUS | Status: AC
  Filled 2024-03-24: qty 20

## 2024-03-24 MED ORDER — PHENYLEPHRINE HCL-NACL 20-0.9 MG/250ML-% IV SOLN
INTRAVENOUS | Status: DC | PRN
  Administered 2024-03-24: 15 ug/min via INTRAVENOUS

## 2024-03-24 MED ORDER — LACTATED RINGERS IV SOLN: INTRAVENOUS | Status: DC

## 2024-03-24 MED ORDER — PHENYLEPHRINE HCL-NACL 20-0.9 MG/250ML-% IV SOLN
INTRAVENOUS | Status: AC
  Filled 2024-03-24: qty 250

## 2024-03-24 MED ORDER — PHENYLEPHRINE 80 MCG/ML (10ML) SYRINGE FOR IV PUSH (FOR BLOOD PRESSURE SUPPORT)
PREFILLED_SYRINGE | INTRAVENOUS | Status: DC | PRN
  Administered 2024-03-24 (×2): 80 ug via INTRAVENOUS

## 2024-03-24 MED ORDER — OXYCODONE HCL 5 MG PO TABS: 5.0000 mg | ORAL_TABLET | ORAL | 0 refills | Status: AC | PRN

## 2024-03-24 MED ORDER — BUPIVACAINE LIPOSOME 1.3 % IJ SUSP
INTRAMUSCULAR | Status: AC
  Filled 2024-03-24: qty 10

## 2024-03-24 MED ORDER — FENTANYL CITRATE (PF) 50 MCG/ML IJ SOSY
50.0000 ug | PREFILLED_SYRINGE | Freq: Once | INTRAMUSCULAR | Status: AC
  Administered 2024-03-24: 50 ug via INTRAVENOUS

## 2024-03-24 MED ORDER — DEXAMETHASONE SOD PHOSPHATE PF 10 MG/ML IJ SOLN
INTRAMUSCULAR | Status: DC | PRN
  Administered 2024-03-24: 4 mg via INTRAVENOUS

## 2024-03-24 MED ORDER — CHLORHEXIDINE GLUCONATE 0.12 % MT SOLN
OROMUCOSAL | Status: AC
Start: 2024-03-24 — End: 2024-03-24
  Filled 2024-03-24: qty 15

## 2024-03-24 MED ORDER — ONDANSETRON 4 MG PO TBDP: 4.0000 mg | ORAL_TABLET | Freq: Three times a day (TID) | ORAL | 0 refills | Status: AC | PRN

## 2024-03-24 MED ORDER — CHLORHEXIDINE GLUCONATE 0.12 % MT SOLN
15.0000 mL | Freq: Once | OROMUCOSAL | Status: AC
  Administered 2024-03-24: 15 mL via OROMUCOSAL

## 2024-03-24 MED ORDER — GLYCOPYRROLATE 0.2 MG/ML IJ SOLN
INTRAMUSCULAR | Status: DC | PRN
Start: 2024-03-24 — End: 2024-03-24
  Administered 2024-03-24: .1 mg via INTRAVENOUS

## 2024-03-24 MED ORDER — DROPERIDOL 2.5 MG/ML IJ SOLN: 0.6250 mg | Freq: Once | INTRAMUSCULAR | Status: DC | PRN

## 2024-03-24 MED ORDER — ACETAMINOPHEN 500 MG PO TABS: 1000.0000 mg | ORAL_TABLET | Freq: Three times a day (TID) | ORAL | 2 refills | Status: AC

## 2024-03-24 MED ORDER — CEFAZOLIN SODIUM-DEXTROSE 2-4 GM/100ML-% IV SOLN
2.0000 g | INTRAVENOUS | Status: AC
  Administered 2024-03-24: 2 g via INTRAVENOUS

## 2024-03-24 MED ORDER — OXYCODONE HCL 5 MG/5ML PO SOLN: 5.0000 mg | Freq: Once | ORAL | Status: DC | PRN

## 2024-03-24 MED ORDER — PROPOFOL 10 MG/ML IV BOLUS
INTRAVENOUS | Status: DC | PRN
  Administered 2024-03-24: 200 mg via INTRAVENOUS

## 2024-03-24 MED ORDER — ACETAMINOPHEN 10 MG/ML IV SOLN
1000.0000 mg | Freq: Once | INTRAVENOUS | Status: DC | PRN
Start: 2024-03-24 — End: 2024-03-24

## 2024-03-24 SURGICAL SUPPLY — 60 items
ADAPTER IRRIG TUBE 2 SPIKE SOL (ADAPTER) ×2 IMPLANT
ANCHOR 2.3 SP SGL 1.2 XBRAID (Anchor) IMPLANT
ANCHOR SWIVELOCK SP KL 4.75 (Anchor) IMPLANT
BLADE SHAVER 4.5X7 STR FR (MISCELLANEOUS) ×1 IMPLANT
BNDG ADH 2 X3.75 FABRIC TAN LF (GAUZE/BANDAGES/DRESSINGS) ×1 IMPLANT
BUR BR 5.5 WIDE MOUTH (BURR) ×1 IMPLANT
CANNULA PART THRD DISP 5.75X7 (CANNULA) IMPLANT
CANNULA PARTIAL THREAD 2X7 (CANNULA) ×1 IMPLANT
CANNULA TWIST IN 8.25X7CM (CANNULA) IMPLANT
CANNULA TWIST IN 8.25X9CM (CANNULA) IMPLANT
CHLORAPREP W/TINT 26 (MISCELLANEOUS) ×1 IMPLANT
COOLER ICEMAN CLASSIC (MISCELLANEOUS) ×1 IMPLANT
COVER LIGHT HANDLE STERIS (MISCELLANEOUS) ×1 IMPLANT
DERMABOND ADVANCED .7 DNX12 (GAUZE/BANDAGES/DRESSINGS) IMPLANT
DRAPE INCISE IOBAN 66X45 STRL (DRAPES) ×1 IMPLANT
ELECTRODE REM PT RTRN 9FT ADLT (ELECTROSURGICAL) ×1 IMPLANT
GAUZE SPONGE 4X4 12PLY STRL (GAUZE/BANDAGES/DRESSINGS) ×1 IMPLANT
GAUZE XEROFORM 1X8 LF (GAUZE/BANDAGES/DRESSINGS) ×1 IMPLANT
GLOVE BIOGEL PI IND STRL 8 (GLOVE) ×2 IMPLANT
GLOVE ORTHO TXT STRL SZ7.5 (GLOVE) ×2 IMPLANT
GLOVE SURG SYN 8.0 PF PI (GLOVE) ×2 IMPLANT
GOWN SRG LRG LVL 4 IMPRV REINF (GOWNS) ×1 IMPLANT
GOWN SRG XL LONG LVL 3 NONREIN (GOWNS) ×1 IMPLANT
GOWN STRL REUS W/ TWL LRG LVL3 (GOWN DISPOSABLE) ×1 IMPLANT
IV LR IRRIG 3000ML ARTHROMATIC (IV SOLUTION) ×4 IMPLANT
KIT STABILIZATION SHOULDER (MISCELLANEOUS) ×1 IMPLANT
KIT TURNOVER KIT A (KITS) ×1 IMPLANT
MANIFOLD NEPTUNE II (INSTRUMENTS) ×1 IMPLANT
MASK FACE SPIDER DISP (MASK) ×1 IMPLANT
MAT ABSORB FLUID 56X50 GRAY (MISCELLANEOUS) ×2 IMPLANT
NDL HYPO 22X1.5 SAFETY MO (MISCELLANEOUS) IMPLANT
NDL MAYO 6 CRC TAPER PT (NEEDLE) IMPLANT
NDL SCORPION MULTI FIRE (NEEDLE) IMPLANT
NEEDLE HYPO 22X1.5 SAFETY MO (MISCELLANEOUS) IMPLANT
NEEDLE MAYO 6 CRC TAPER PT (NEEDLE) IMPLANT
NEEDLE SCORPION MULTI FIRE (NEEDLE) IMPLANT
PACK ARTHROSCOPY SHOULDER (MISCELLANEOUS) ×1 IMPLANT
PAD ABD DERMACEA PRESS 5X9 (GAUZE/BANDAGES/DRESSINGS) ×1 IMPLANT
PAD ARMBOARD POSITIONER FOAM (MISCELLANEOUS) ×2 IMPLANT
PAD COLD SHLDR SM WRAP-ON (PAD) ×1 IMPLANT
PASSER SUT FIRSTPASS SELF (INSTRUMENTS) IMPLANT
SLING ULTRA II M (MISCELLANEOUS) IMPLANT
SPONGE T-LAP 18X18 ~~LOC~~+RFID (SPONGE) ×1 IMPLANT
STRAP SAFETY 5IN WIDE (MISCELLANEOUS) ×1 IMPLANT
SUT ETHILON 3-0 (SUTURE) ×1 IMPLANT
SUT LASSO 90 DEG SD STR (SUTURE) IMPLANT
SUT PROLENE 0 CT 2 (SUTURE) IMPLANT
SUT TICRON 2-0 30IN 311381 (SUTURE) IMPLANT
SUT VIC AB 0 CT1 36 (SUTURE) IMPLANT
SUT VIC AB 2-0 CT2 27 (SUTURE) IMPLANT
SUTURE MNCRL 4-0 27XMF (SUTURE) IMPLANT
SUTURE TAPE 1.3 40 TPR END (SUTURE) IMPLANT
SYR 10ML LL (SYRINGE) IMPLANT
SYSTEM IMPL TENODESIS LNT 2.9 (Orthopedic Implant) IMPLANT
TAPE MICROFOAM 4IN (TAPE) ×1 IMPLANT
TRAP FLUID SMOKE EVACUATOR (MISCELLANEOUS) ×1 IMPLANT
TUBE SET DOUBLEFLO INFLOW (TUBING) ×1 IMPLANT
TUBE SET DOUBLEFLO OUTFLOW (TUBING) ×1 IMPLANT
WAND WEREWOLF FLOW 90D (MISCELLANEOUS) ×1 IMPLANT
WATER STERILE IRR 500ML POUR (IV SOLUTION) ×1 IMPLANT

## 2024-03-24 NOTE — Assessment & Plan Note (Signed)
Check lipid panel.  Continue Crestor 40 mg daily. 

## 2024-03-24 NOTE — Discharge Instructions (Signed)

## 2024-03-24 NOTE — Assessment & Plan Note (Signed)
 Chronic issue.   -Continue Airsupra 

## 2024-03-24 NOTE — H&P (Signed)
 Paper H&P to be scanned into permanent record. H&P reviewed. No significant changes noted.

## 2024-03-24 NOTE — Assessment & Plan Note (Signed)
 Patient BP  Vitals:   03/12/24 1502  BP: 134/82    in the office. -Advised pt to follow a low sodium and heart healthy diet. - Refill amlodipine  prescription. - Encourage resumption of amlodipine .

## 2024-03-24 NOTE — Anesthesia Preprocedure Evaluation (Addendum)
 Anesthesia Evaluation  Patient identified by MRN, date of birth, ID band Patient awake    Reviewed: Allergy  & Precautions, H&P , NPO status , Patient's Chart, lab work & pertinent test results  Airway Mallampati: II  TM Distance: >3 FB Neck ROM: full    Dental no notable dental hx.    Pulmonary asthma , former smoker   Pulmonary exam normal        Cardiovascular hypertension, Normal cardiovascular exam+ dysrhythmias (s/p open repair for Wolff-Parkinson-White (WPW) syndrome)      Neuro/Psych negative neurological ROS  negative psych ROS   GI/Hepatic negative GI ROS, Neg liver ROS,,,  Endo/Other  negative endocrine ROS    Renal/GU      Musculoskeletal   Abdominal  (+) + obese  Peds  Hematology negative hematology ROS (+)   Anesthesia Other Findings Past Medical History: No date: Alcoholism in recovery [facility])     Comment:  recovery since 2011 No date: Allergy  No date: Asthma No date: Hypertension No date: Wolff-Parkinson-White (WPW) syndrome  Past Surgical History: No date: CARDIAC SURGERY 09/30/2020: COLONOSCOPY WITH PROPOFOL ; N/A     Comment:  Procedure: COLONOSCOPY WITH PROPOFOL ;  Surgeon: Therisa Bi, MD;  Location: [facility] ENDOSCOPY;  Service:               Gastroenterology;  Laterality: N/A; No date: CORONARY ARTERY BYPASS GRAFT     Comment:  age 53     Reproductive/Obstetrics negative OB ROS                              Anesthesia Physical Anesthesia Plan  ASA: 2  Anesthesia Plan: General ETT   Post-op Pain Management: Regional block*   Induction: Intravenous  PONV Risk Score and Plan: 2 and Ondansetron, Dexamethasone and Midazolam  Airway Management Planned: Oral ETT  Additional Equipment:   Intra-op Plan:   Post-operative Plan: Extubation in OR  Informed Consent: I have reviewed the patients History and Physical, chart, labs and discussed the  procedure including the risks, benefits and alternatives for the proposed anesthesia with the patient or authorized representative who has indicated his/her understanding and acceptance.     Dental Advisory Given  Plan Discussed with: CRNA and Surgeon  Anesthesia Plan Comments:          Anesthesia Quick Evaluation

## 2024-03-24 NOTE — Anesthesia Procedure Notes (Signed)
 Anesthesia Regional Block: Interscalene brachial plexus block   Pre-Anesthetic Checklist: , timeout performed,  Correct Patient, Correct Site, Correct Laterality,  Correct Procedure, Correct Position, site marked,  Risks and benefits discussed,  Surgical consent,  Pre-op evaluation,  At surgeon's request and post-op pain management  Laterality: Upper and Left  Prep: chloraprep       Needles:  Injection technique: Single-shot  Needle Type: Stimiplex     Needle Length: 9cm  Needle Gauge: 22     Additional Needles:   Procedures:,,,, ultrasound used (permanent image in chart),,    Narrative:  Start time: 03/24/2024 1:25 PM End time: 03/24/2024 1:28 PM Injection made incrementally with aspirations every 5 mL.  Performed by: Personally  Anesthesiologist: Vicci Camellia Glatter, MD  Additional Notes: Patient consented for risk and benefits of nerve block including but not limited to nerve damage, failed block, bleeding and infection.  Patient voiced understanding.  Functioning IV was confirmed and monitors were applied.  Timeout done prior to procedure and prior to any sedation being given to the patient.  Patient confirmed procedure site prior to any sedation given to the patient. Sterile prep,hand hygiene and sterile gloves were used.  Minimal sedation used for procedure.  No paresthesia endorsed by patient during the procedure.  Negative aspiration and negative test dose prior to incremental administration of local anesthetic. The patient tolerated the procedure well with no immediate complications.

## 2024-03-24 NOTE — Assessment & Plan Note (Signed)
We will check A1c ?

## 2024-03-24 NOTE — Anesthesia Procedure Notes (Signed)
 Procedure Name: Intubation Date/Time: 03/24/2024 2:36 PM  Performed by: Myra Lawless, CRNAPre-anesthesia Checklist: Patient identified, Patient being monitored, Timeout performed, Emergency Drugs available and Suction available Patient Re-evaluated:Patient Re-evaluated prior to induction Oxygen Delivery Method: Circle system utilized Preoxygenation: Pre-oxygenation with 100% oxygen Induction Type: IV induction Ventilation: Mask ventilation without difficulty Laryngoscope Size: Mac and 4 Grade View: Grade I Tube type: Oral Tube size: 7.5 mm Number of attempts: 1 Airway Equipment and Method: Stylet Placement Confirmation: ETT inserted through vocal cords under direct vision, positive ETCO2 and breath sounds checked- equal and bilateral Secured at: 22 cm Tube secured with: Tape Dental Injury: Teeth and Oropharynx as per pre-operative assessment
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
## 2024-03-24 NOTE — Transfer of Care (Signed)
 Immediate Anesthesia Transfer of Care Note  Patient: William Gentry  Procedure(s) Performed: ARTHROSCOPY, SHOULDER WITH REPAIR, ROTATOR CUFF (Left: Shoulder) DECOMPRESSION, SUBACROMIAL SPACE (Left: Shoulder) TENODESIS, BICEPS (Left: Shoulder)  Patient Location: PACU  Anesthesia Type:General  Level of Consciousness: drowsy and patient cooperative  Airway & Oxygen Therapy: Patient Spontanous Breathing and Patient connected to face mask oxygen  Post-op Assessment: Report given to RN and Post -op Vital signs reviewed and stable  Post vital signs: Reviewed and stable  Last Vitals:  Vitals Value Taken Time  BP 112/56 03/24/24 17:03  Temp 36.4 C 03/24/24 17:03  Pulse 72 03/24/24 17:15  Resp 18 03/24/24 17:15  SpO2 96 % 03/24/24 17:15  Vitals shown include unfiled device data.  Last Pain:  Vitals:   03/24/24 1703  TempSrc:   PainSc: Asleep         Complications: No notable events documented.

## 2024-03-25 NOTE — Anesthesia Postprocedure Evaluation (Signed)
 Anesthesia Post Note  Patient: William Gentry  Procedure(s) Performed: ARTHROSCOPY, SHOULDER WITH REPAIR, ROTATOR CUFF (Left: Shoulder) DECOMPRESSION, SUBACROMIAL SPACE (Left: Shoulder) TENODESIS, BICEPS (Left: Shoulder)  Patient location during evaluation: PACU Anesthesia Type: General Level of consciousness: awake and alert Pain management: pain level controlled Vital Signs Assessment: post-procedure vital signs reviewed and stable Respiratory status: spontaneous breathing, nonlabored ventilation, respiratory function stable and patient connected to nasal cannula oxygen Cardiovascular status: blood pressure returned to baseline and stable Postop Assessment: no apparent nausea or vomiting Anesthetic complications: no   No notable events documented.   Last Vitals:  Vitals:   03/24/24 1730 03/24/24 1755  BP: 108/69 113/78  Pulse: 69 66  Resp: 14 16  Temp:  36.6 C  SpO2: 90% 97%    Last Pain:  Vitals:   03/24/24 1755  TempSrc: Temporal  PainSc: 0-No pain                 Prentice Murphy

## 2024-03-26 ENCOUNTER — Encounter: Admitting: Nurse Practitioner

## 2024-03-26 ENCOUNTER — Encounter: Payer: Self-pay | Admitting: Orthopedic Surgery

## 2024-03-30 DIAGNOSIS — Z9889 Other specified postprocedural states: Secondary | ICD-10-CM | POA: Diagnosis not present

## 2024-03-30 DIAGNOSIS — M25612 Stiffness of left shoulder, not elsewhere classified: Secondary | ICD-10-CM | POA: Diagnosis not present

## 2024-04-05 ENCOUNTER — Other Ambulatory Visit: Payer: Self-pay | Admitting: Internal Medicine

## 2024-04-05 DIAGNOSIS — J4530 Mild persistent asthma, uncomplicated: Secondary | ICD-10-CM

## 2024-04-06 DIAGNOSIS — M6281 Muscle weakness (generalized): Secondary | ICD-10-CM | POA: Diagnosis not present

## 2024-04-06 DIAGNOSIS — M25612 Stiffness of left shoulder, not elsewhere classified: Secondary | ICD-10-CM | POA: Diagnosis not present

## 2024-04-06 DIAGNOSIS — M25512 Pain in left shoulder: Secondary | ICD-10-CM | POA: Diagnosis not present

## 2024-04-06 DIAGNOSIS — Z9889 Other specified postprocedural states: Secondary | ICD-10-CM | POA: Diagnosis not present

## 2024-04-13 DIAGNOSIS — M6281 Muscle weakness (generalized): Secondary | ICD-10-CM | POA: Diagnosis not present

## 2024-04-13 DIAGNOSIS — Z9889 Other specified postprocedural states: Secondary | ICD-10-CM | POA: Diagnosis not present

## 2024-04-20 ENCOUNTER — Other Ambulatory Visit: Payer: Self-pay | Admitting: Nurse Practitioner

## 2024-04-20 DIAGNOSIS — Z9889 Other specified postprocedural states: Secondary | ICD-10-CM | POA: Diagnosis not present

## 2024-04-20 DIAGNOSIS — M6281 Muscle weakness (generalized): Secondary | ICD-10-CM | POA: Diagnosis not present

## 2024-04-20 DIAGNOSIS — M25512 Pain in left shoulder: Secondary | ICD-10-CM | POA: Diagnosis not present

## 2024-04-20 DIAGNOSIS — M25612 Stiffness of left shoulder, not elsewhere classified: Secondary | ICD-10-CM | POA: Diagnosis not present

## 2024-04-20 NOTE — Telephone Encounter (Unsigned)
 Copied from CRM #[id number]. Topic: Clinical - Medication Refill >> Apr 20, 2024  4:15 PM Sasha M wrote: Medication: Albuterol -Budesonide  (AIRSUPRA ) 90-80 MCG/ACT AERO  Has the patient contacted their pharmacy? Yes (Agent: If no, request that the patient contact the pharmacy for the refill. If patient does not wish to contact the pharmacy document the reason why and proceed with request.) (Agent: If yes, when and what did the pharmacy advise?) No refills  This is the patient's preferred pharmacy:  [facility] [address] [place] - [address] [place] [address] Phone: [phone] Fax: [phone]  Is this the correct pharmacy for this prescription? Yes If no, delete pharmacy and type the correct one.   Has the prescription been filled recently? No  Is the patient out of the medication? Yes  Has the patient been seen for an appointment in the last year OR does the patient have an upcoming appointment? Yes  Can we respond through MyChart? Yes  Agent: Please be advised that Rx refills may take up to 3 business days. We ask that you follow-up with your pharmacy.

## 2024-04-21 MED ORDER — AIRSUPRA 90-80 MCG/ACT IN AERO: 2.0000 | INHALATION_SPRAY | RESPIRATORY_TRACT | 2 refills | Status: AC | PRN

## 2024-04-28 DIAGNOSIS — M25612 Stiffness of left shoulder, not elsewhere classified: Secondary | ICD-10-CM | POA: Diagnosis not present

## 2024-04-28 DIAGNOSIS — M6281 Muscle weakness (generalized): Secondary | ICD-10-CM | POA: Diagnosis not present

## 2024-04-28 DIAGNOSIS — M25512 Pain in left shoulder: Secondary | ICD-10-CM | POA: Diagnosis not present

## 2024-04-28 DIAGNOSIS — Z9889 Other specified postprocedural states: Secondary | ICD-10-CM | POA: Diagnosis not present

## 2024-05-11 DIAGNOSIS — Z9889 Other specified postprocedural states: Secondary | ICD-10-CM | POA: Diagnosis not present

## 2024-05-11 DIAGNOSIS — M6281 Muscle weakness (generalized): Secondary | ICD-10-CM | POA: Diagnosis not present

## 2024-05-11 DIAGNOSIS — M25512 Pain in left shoulder: Secondary | ICD-10-CM | POA: Diagnosis not present

## 2024-05-11 DIAGNOSIS — M25612 Stiffness of left shoulder, not elsewhere classified: Secondary | ICD-10-CM | POA: Diagnosis not present

## 2024-05-13 DIAGNOSIS — Z9889 Other specified postprocedural states: Secondary | ICD-10-CM | POA: Diagnosis not present

## 2024-05-13 DIAGNOSIS — M25512 Pain in left shoulder: Secondary | ICD-10-CM | POA: Diagnosis not present

## 2024-05-13 DIAGNOSIS — M6281 Muscle weakness (generalized): Secondary | ICD-10-CM | POA: Diagnosis not present

## 2024-05-13 DIAGNOSIS — M25612 Stiffness of left shoulder, not elsewhere classified: Secondary | ICD-10-CM | POA: Diagnosis not present

## 2024-06-07 ENCOUNTER — Other Ambulatory Visit: Payer: Self-pay | Admitting: Internal Medicine

## 2024-06-07 DIAGNOSIS — J4530 Mild persistent asthma, uncomplicated: Secondary | ICD-10-CM

## 2024-06-10 NOTE — Telephone Encounter (Signed)
 Patient has called for update on medication. Advised of the 3 day policy.

## 2024-06-11 NOTE — Telephone Encounter (Signed)
 Please call and check is he taking airsupra  or albuterol .

## 2024-06-18 ENCOUNTER — Encounter: Admitting: Nurse Practitioner

## 2024-06-18 DIAGNOSIS — E7800 Pure hypercholesterolemia, unspecified: Secondary | ICD-10-CM

## 2024-06-18 DIAGNOSIS — I10 Essential (primary) hypertension: Secondary | ICD-10-CM

## 2024-06-22 ENCOUNTER — Telehealth: Payer: Self-pay

## 2024-06-22 NOTE — Telephone Encounter (Signed)
 Copied from CRM #[id number]. Topic: Clinical - Prescription Issue >> Jun 22, 2024 10:20 AM Nessti S wrote: Reason for CRM: pt called because it has been almost 2 weeks and he hasn't heard anything about his inhaler prescription. He stated that he is taking the ALBUTEROL  and would like it refilled soon as possible.

## 2024-06-23 MED ORDER — ALBUTEROL SULFATE HFA 108 (90 BASE) MCG/ACT IN AERS: 2.0000 | INHALATION_SPRAY | Freq: Four times a day (QID) | RESPIRATORY_TRACT | 2 refills | Status: AC | PRN

## 2024-06-23 NOTE — Telephone Encounter (Signed)
 Attached phone note to original phone note from 06/11/24 and sent to Charanpreet Vincente, Rosina Hummer and Nathanel Moats.

## 2024-06-23 NOTE — Addendum Note (Signed)
 Addended by: VINCENTE SABER on: 06/23/2024 01:00 PM   Modules accepted: Orders

## 2024-06-23 NOTE — Telephone Encounter (Signed)
 Copied from CRM #[id number]. Topic: Clinical - Prescription Issue >> Jun 22, 2024 10:20 AM Nessti S wrote: Reason for CRM: pt called because it has been almost 2 weeks and he hasn't heard anything about his inhaler prescription. He stated that he is taking the ALBUTEROL  and would like it refilled soon as possible.

## 2024-06-23 NOTE — Telephone Encounter (Signed)
 Please inform pt that the medication has been sent.
